# Patient Record
Sex: Male | Born: 1965 | Race: Black or African American | Hispanic: No | Smoking: Current every day smoker
Health system: Southern US, Community
[De-identification: ages and names within clinical notes are randomized; demographics above are authoritative.]

## PROBLEM LIST (undated history)

## (undated) DIAGNOSIS — I10 Essential (primary) hypertension: Secondary | ICD-10-CM

## (undated) DIAGNOSIS — M25519 Pain in unspecified shoulder: Secondary | ICD-10-CM

## (undated) DIAGNOSIS — I639 Cerebral infarction, unspecified: Secondary | ICD-10-CM

## (undated) DIAGNOSIS — G8929 Other chronic pain: Secondary | ICD-10-CM

---

## 2004-09-09 ENCOUNTER — Emergency Department (HOSPITAL_COMMUNITY): Admission: EM | Admit: 2004-09-09 | Discharge: 2004-09-09 | Payer: Self-pay | Admitting: Emergency Medicine

## 2004-09-11 ENCOUNTER — Emergency Department (HOSPITAL_COMMUNITY): Admission: EM | Admit: 2004-09-11 | Discharge: 2004-09-11 | Payer: Self-pay | Admitting: Emergency Medicine

## 2004-09-16 ENCOUNTER — Other Ambulatory Visit: Admission: RE | Admit: 2004-09-16 | Discharge: 2004-09-16 | Payer: Self-pay | Admitting: General Surgery

## 2005-03-31 ENCOUNTER — Emergency Department (HOSPITAL_COMMUNITY): Admission: EM | Admit: 2005-03-31 | Discharge: 2005-03-31 | Payer: Self-pay | Admitting: Emergency Medicine

## 2008-09-17 ENCOUNTER — Emergency Department (HOSPITAL_COMMUNITY): Admission: EM | Admit: 2008-09-17 | Discharge: 2008-09-17 | Payer: Self-pay | Admitting: Emergency Medicine

## 2008-12-31 ENCOUNTER — Emergency Department (HOSPITAL_COMMUNITY): Admission: EM | Admit: 2008-12-31 | Discharge: 2008-12-31 | Payer: Self-pay | Admitting: Emergency Medicine

## 2010-11-26 ENCOUNTER — Emergency Department (HOSPITAL_COMMUNITY)
Admission: EM | Admit: 2010-11-26 | Discharge: 2010-11-26 | Disposition: A | Discharge status: Home / Self Care | Payer: BC Managed Care – PPO | Attending: Emergency Medicine | Admitting: Emergency Medicine

## 2010-11-26 DIAGNOSIS — E860 Dehydration: Secondary | ICD-10-CM | POA: Insufficient documentation

## 2010-11-26 LAB — CBC
Hemoglobin: 14.6 g/dL (ref 13.0–17.0)
MCH: 28 pg (ref 26.0–34.0)
Platelets: 235 10*3/uL (ref 150–400)
RBC: 5.22 MIL/uL (ref 4.22–5.81)
WBC: 8.4 10*3/uL (ref 4.0–10.5)

## 2010-11-26 LAB — BASIC METABOLIC PANEL
CO2: 23 mEq/L (ref 19–32)
Chloride: 101 mEq/L (ref 96–112)
GFR calc Af Amer: >60 mL/min (ref >60)
Potassium: 4.4 mEq/L (ref 3.5–5.1)
Sodium: 135 mEq/L (ref 135–145)

## 2010-11-26 LAB — URINALYSIS, ROUTINE W REFLEX MICROSCOPIC
Glucose, UA: NEGATIVE mg/dL
Ketones, ur: 15 mg/dL — AB
Leukocytes, UA: NEGATIVE
Protein, ur: NEGATIVE mg/dL
pH: 5.5 (ref 5.0–8.0)

## 2010-11-26 LAB — DIFFERENTIAL
Basophils Relative: 1 % (ref 0–1)
Eosinophils Absolute: 0.1 10*3/uL (ref 0.0–0.7)
Monocytes Relative: 6 % (ref 3–12)
Neutro Abs: 5.5 10*3/uL (ref 1.7–7.7)
Neutrophils Relative %: 66 % (ref 43–77)

## 2010-11-26 LAB — URINE MICROSCOPIC-ADD ON

## 2010-12-05 NOTE — Consult Note (Signed)
NAMELONGINO, TREFZ NO.:  [id number]   MEDICAL RECORD NO.:  [id number]          PATIENT TYPE:  EMS   LOCATION:  ED                            FACILITY:  [facility]   PHYSICIAN:  Barbaraann Barthel, M.D. DATE OF BIRTH:  [birth year]   DATE OF CONSULTATION:  DATE OF DISCHARGE:  09/11/2004                                   CONSULTATION   EMERGENCY ROOM FOLLOW-UP:  Dear Dr. Rosalia Hammers:   I saw [name] in my office on September 16, 2004, as a referral from the  emergency department.  He had a partially drained abscess on his posterior  scalp.  This was partially drained in the emergency room and cultures were  obtained there.  The results of these have not been sent to [place] as yet.   In essence, I opened this up widely and removed what appeared to be chronic  granulation tissue.  This could possibly be an epidermoid inclusion cyst,  but this possibly could be a tumor as well.  I sent this to pathology for  follow-up.  The wound was packed open, and we will continue wound care here  in my office.  We have made arrangements for him on the following day on  September 17, 2004, and the patient did not show up.  We have called him on September 18, 2004, and he still did not show up.  I suspect you will be seeing him in  the emergency room and hopefully not in great extremis.   At any rate, we have tried to oblige with this emergency room referral and  the patient has been very noncompliant.   Your sincerely,      WB/MEDQ  D:  09/18/2004  T:  09/19/2004  Job:  [id number]   cc:   Cameron [name], M.D.  [address]  [place]  [place] [address]  Fax: [phone]

## 2014-07-09 ENCOUNTER — Emergency Department (HOSPITAL_COMMUNITY)
Admission: EM | Admit: 2014-07-09 | Discharge: 2014-07-09 | Disposition: A | Discharge status: Home / Self Care | Payer: PRIVATE HEALTH INSURANCE | Attending: Emergency Medicine | Admitting: Emergency Medicine

## 2014-07-09 ENCOUNTER — Emergency Department (HOSPITAL_COMMUNITY): Payer: PRIVATE HEALTH INSURANCE

## 2014-07-09 ENCOUNTER — Encounter (HOSPITAL_COMMUNITY): Payer: Self-pay | Admitting: Emergency Medicine

## 2014-07-09 DIAGNOSIS — S00211A Abrasion of right eyelid and periocular area, initial encounter: Secondary | ICD-10-CM

## 2014-07-09 DIAGNOSIS — S6992XA Unspecified injury of left wrist, hand and finger(s), initial encounter: Secondary | ICD-10-CM | POA: Diagnosis present

## 2014-07-09 DIAGNOSIS — Z720 Tobacco use: Secondary | ICD-10-CM | POA: Diagnosis not present

## 2014-07-09 DIAGNOSIS — Y998 Other external cause status: Secondary | ICD-10-CM | POA: Insufficient documentation

## 2014-07-09 DIAGNOSIS — Y9289 Other specified places as the place of occurrence of the external cause: Secondary | ICD-10-CM | POA: Diagnosis not present

## 2014-07-09 DIAGNOSIS — Z79899 Other long term (current) drug therapy: Secondary | ICD-10-CM | POA: Diagnosis not present

## 2014-07-09 DIAGNOSIS — W19XXXA Unspecified fall, initial encounter: Secondary | ICD-10-CM

## 2014-07-09 DIAGNOSIS — Z791 Long term (current) use of non-steroidal anti-inflammatories (NSAID): Secondary | ICD-10-CM | POA: Insufficient documentation

## 2014-07-09 DIAGNOSIS — S60222A Contusion of left hand, initial encounter: Secondary | ICD-10-CM

## 2014-07-09 DIAGNOSIS — Y9389 Activity, other specified: Secondary | ICD-10-CM | POA: Insufficient documentation

## 2014-07-09 DIAGNOSIS — Z859 Personal history of malignant neoplasm, unspecified: Secondary | ICD-10-CM | POA: Diagnosis not present

## 2014-07-09 DIAGNOSIS — S0081XA Abrasion of other part of head, initial encounter: Secondary | ICD-10-CM | POA: Insufficient documentation

## 2014-07-09 MED ORDER — IBUPROFEN 800 MG PO TABS: 800.0000 mg | ORAL_TABLET | Freq: Three times a day (TID) | ORAL | Status: DC

## 2014-07-09 MED ORDER — HYDROCODONE-ACETAMINOPHEN 5-325 MG PO TABS: 1.0000 | ORAL_TABLET | ORAL | Status: DC | PRN

## 2014-07-09 MED ORDER — OXYCODONE-ACETAMINOPHEN 5-325 MG PO TABS
1.0000 | ORAL_TABLET | Freq: Once | ORAL | Status: AC
  Administered 2014-07-09: 1 via ORAL
  Filled 2014-07-09: qty 1

## 2014-07-09 MED ORDER — OXYCODONE-ACETAMINOPHEN 5-325 MG PO TABS: 2.0000 | ORAL_TABLET | ORAL | Status: DC | PRN

## 2014-07-09 MED ORDER — BACITRACIN-NEOMYCIN-POLYMYXIN 400-5-5000 EX OINT
TOPICAL_OINTMENT | Freq: Once | CUTANEOUS | Status: AC
  Administered 2014-07-09: 01:00:00 via TOPICAL
  Filled 2014-07-09: qty 1

## 2014-07-09 NOTE — ED Notes (Signed)
Discharge instructions given, pt demonstrated teach back and verbal understanding. No concerns voiced.  
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
## 2014-07-09 NOTE — Discharge Instructions (Signed)
Bicycling, Rules for Helmets °Properly wearing a helmet when cycling is your best means of protection against injury. You need to know the right way to wear a helmet and what kind of helmet to buy. °WEAR A HELMET °· A helmet is your last line of defense in an accident; never ride without one. °· Helmets can reduce serious head injuries by 85% in a crash. °ALWAYS WEAR A PROPERLY FITTING HELMET °· A helmet will not protect you if it does not fit properly. °· Make sure that the helmet fits on top of the head, not tipped back. °· Always wear a helmet while riding a bike, no matter how short the trip. °· After a crash or any impact that affects your helmet, replace it immediately. °SHELL AND PADS °· Find the smallest helmet shell size that fits over your head. °· Helmet pads should not be used to make a helmet fit your head that is otherwise too big. °· Leave about two-fingers width between your eyebrows and the front brim of the helmet. °STRAPS °· The straps should be joined just under each ear at the jawbone. °· The buckle should be snug with your mouth completely open. °· Periodically check your strap adjustment. Improper fit can make a helmet useless. °VENTILATION °· In general, the more vents the better. Improper ventilation can cause overheating. °· Helmets with good ventilation can actually be cooler than riding with no helmet at all. °· More vents usually mean a higher priced helmet. Buy one that you will want to wear. °COLORS °· Helmets come in all different colors and models. Buy a highly visible color. °· Shell color does not affect the temperature; black shell will not be hotter in the sun. °· Pick a color that encourages you to wear it. °Document Released: 07/09/2003 Document Revised: 09/28/2011 Document Reviewed: 11/30/2008 °ExitCare® Patient Information ©2015 [employer]. This information is not intended to replace advice given to you by your health care provider. Make sure you discuss any questions you  have with your health care provider. ° °

## 2016-03-10 ENCOUNTER — Encounter: Payer: Self-pay | Admitting: Orthopaedic Surgery

## 2016-03-10 ENCOUNTER — Ambulatory Visit (INDEPENDENT_AMBULATORY_CARE_PROVIDER_SITE_OTHER): Payer: BLUE CROSS/BLUE SHIELD

## 2016-03-10 ENCOUNTER — Ambulatory Visit (INDEPENDENT_AMBULATORY_CARE_PROVIDER_SITE_OTHER): Payer: BLUE CROSS/BLUE SHIELD | Admitting: Orthopaedic Surgery

## 2016-03-10 VITALS — BP 160/105 | HR 91 | Temp 98.2°F | Resp 16 | Wt 190.0 lb

## 2016-03-10 DIAGNOSIS — Z720 Tobacco use: Secondary | ICD-10-CM | POA: Diagnosis not present

## 2016-03-10 DIAGNOSIS — M5441 Lumbago with sciatica, right side: Secondary | ICD-10-CM | POA: Diagnosis not present

## 2016-03-10 DIAGNOSIS — F17200 Nicotine dependence, unspecified, uncomplicated: Secondary | ICD-10-CM

## 2016-03-10 MED ORDER — PREDNISONE 5 MG (21) PO TBPK: ORAL_TABLET | ORAL | 0 refills | Status: DC

## 2016-03-10 MED ORDER — TIZANIDINE HCL 4 MG PO TABS: ORAL_TABLET | ORAL | 3 refills | Status: DC

## 2016-03-10 MED ORDER — HYDROCODONE-ACETAMINOPHEN 7.5-325 MG PO TABS: ORAL_TABLET | ORAL | 0 refills | Status: DC

## 2016-03-10 NOTE — Progress Notes (Signed)
Subjective: my back hurts    Patient ID: Cameron [name], male    DOB: Jan 03, 1966, 50 y.o.   MRN: [id number]  HPI He has had pain in the lower back since Friday the 18th of August.  He awoke and noted his lower back was very painful.  He has had pain down the right leg to the right foot.  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  He does not recall any motion or injury.  He has no bowel or bladder problems.  He is not getting any better.  He cannot fully stand erect without pain.    He has tried ice, heat, rest, Advil with no help.  He was unable to go to work yesterday or today.  He has not had any problem like this before.   Review of Systems  HENT: Negative for congestion.   Respiratory: Negative for cough and shortness of breath.   Cardiovascular: Negative for chest pain and leg swelling.  Endocrine: Negative for cold intolerance.  Musculoskeletal: Positive for arthralgias and back pain.  Allergic/Immunologic: Negative for environmental allergies.   Past Medical History:  Diagnosis Date  . Cancer (HCC)     No past surgical history on file.  Current Outpatient Prescriptions on File Prior to Visit  Medication Sig Dispense Refill  . lisinopril (PRINIVIL,ZESTRIL) 30 MG tablet Take 30 mg by mouth daily.     No current facility-administered medications on file prior to visit.     Social History   Social History  . Marital status: Single    Spouse name: N/A  . Number of children: N/A  . Years of education: N/A   Occupational History  . Not on file.   Social History Main Topics  . Smoking status: Current Every Day Smoker  . Smokeless tobacco: Never Used  . Alcohol use Yes  . Drug use: No  . Sexual activity: Not on file   Other Topics Concern  . Not on file   Social History Narrative  . No narrative on file    History of hypertension and heart disease in the family.  BP (!) 160/105   Pulse 91   Temp 98.2 F (36.8 C)   Resp 16   Wt 190 lb (86.2 kg)    BMI 27.26 kg/m       Objective:   Physical Exam  Constitutional: He is oriented to person, place, and time. He appears well-developed and well-nourished.  HENT:  Head: Normocephalic and atraumatic.  Eyes: Conjunctivae and EOM are normal. Pupils are equal, round, and reactive to light.  Neck: Normal range of motion. Neck supple.  Cardiovascular: Normal rate, regular rhythm and intact distal pulses.   Pulmonary/Chest: Effort normal.  Abdominal: Soft.  Musculoskeletal: He exhibits tenderness (Back is tender with motion.  Unable to fully stand erect without pain.).  Neurological: He is alert and oriented to person, place, and time. He has normal reflexes. He displays normal reflexes. No cranial nerve deficit. He exhibits normal muscle tone. Coordination normal.  Skin: Skin is warm and dry.  Psychiatric: He has a normal mood and affect. His behavior is normal. Judgment and thought content normal.  Vitals reviewed.  Spine/Pelvis examination:  Inspection:  Overall, sacoiliac joint benign and hips nontender; without crepitus or defects.   Thoracic spine inspection: Alignment normal without kyphosis present   Lumbar spine inspection:  Alignment  with normal lumbar lordosis, without scoliosis apparent.   Thoracic spine palpation:  without tenderness  of spinal processes   Lumbar spine palpation: with tenderness of lumbar area; with tightness of lumbar muscles    Range of Motion:   Lumbar flexion, forward flexion is 20 with pain or tenderness    Lumbar extension is 5 with pain or tenderness   Left lateral bend is Normal  without pain or tenderness   Right lateral bend is Normal without pain or tenderness   Straight leg raising is Abnormal- at 30 degrees on the right   Strength & tone: Normal   Stability overall normal stability    X-rays were done of the lumbar spine, reported separately.  He smokes and I have talked to him about this.  He will consider cutting back.      Assessment & Plan:   Encounter Diagnoses  Name Primary?  . Right-sided low back pain with right-sided sciatica Yes  . Tobacco smoker within last 12 months    I am concerned about a HNP on the right side of the lumbar spine.  I have given Rx for pain medicine and prednisone dose pack.  Precautions discussed.  I have recommended he stay out of work.  Return in one week.  Exercises given.  He may need MRI.  Call if any problem.  Electronically Signed Darreld McleanWayne Jadore Veals, MD 8/22/201711:56 AM

## 2016-03-10 NOTE — Patient Instructions (Signed)
May be out of work secondary to lower back pain and sciatica. Back Exercises The following exercises strengthen the muscles that help to support the back. They also help to keep the lower back flexible. Doing these exercises can help to prevent back pain or lessen existing pain. If you have back pain or discomfort, try doing these exercises 2-3 times each day or as told by your health care provider. When the pain goes away, do them once each day, but increase the number of times that you repeat the steps for each exercise (do more repetitions). If you do not have back pain or discomfort, do these exercises once each day or as told by your health care provider. EXERCISES Single Knee to Chest Repeat these steps 3-5 times for each leg: 1. Lie on your back on a firm bed or the floor with your legs extended. 2. Bring one knee to your chest. Your other leg should stay extended and in contact with the floor. 3. Hold your knee in place by grabbing your knee or thigh. 4. Pull on your knee until you feel a gentle stretch in your lower back. 5. Hold the stretch for 10-30 seconds. 6. Slowly release and straighten your leg. Pelvic Tilt Repeat these steps 5-10 times: 1. Lie on your back on a firm bed or the floor with your legs extended. 2. Bend your knees so they are pointing toward the ceiling and your feet are flat on the floor. 3. Tighten your lower abdominal muscles to press your lower back against the floor. This motion will tilt your pelvis so your tailbone points up toward the ceiling instead of pointing to your feet or the floor. 4. With gentle tension and even breathing, hold this position for 5-10 seconds. Cat-Cow Repeat these steps until your lower back becomes more flexible: 1. Get into a hands-and-knees position on a firm surface. Keep your hands under your shoulders, and keep your knees under your hips. You may place padding under your knees for comfort. 2. Let your head hang down, and point  your tailbone toward the floor so your lower back becomes rounded like the back of a cat. 3. Hold this position for 5 seconds. 4. Slowly lift your head and point your tailbone up toward the ceiling so your back forms a sagging arch like the back of a cow. 5. Hold this position for 5 seconds. Press-Ups Repeat these steps 5-10 times: 1. Lie on your abdomen (face-down) on the floor. 2. Place your palms near your head, about shoulder-width apart. 3. While you keep your back as relaxed as possible and keep your hips on the floor, slowly straighten your arms to raise the top half of your body and lift your shoulders. Do not use your back muscles to raise your upper torso. You may adjust the placement of your hands to make yourself more comfortable. 4. Hold this position for 5 seconds while you keep your back relaxed. 5. Slowly return to lying flat on the floor. Bridges Repeat these steps 10 times: 1. Lie on your back on a firm surface. 2. Bend your knees so they are pointing toward the ceiling and your feet are flat on the floor. 3. Tighten your buttocks muscles and lift your buttocks off of the floor until your waist is at almost the same height as your knees. You should feel the muscles working in your buttocks and the back of your thighs. If you do not feel these muscles, slide your feet 1-2  inches farther away from your buttocks. 4. Hold this position for 3-5 seconds. 5. Slowly lower your hips to the starting position, and allow your buttocks muscles to relax completely. If this exercise is too easy, try doing it with your arms crossed over your chest. Abdominal Crunches Repeat these steps 5-10 times: 1. Lie on your back on a firm bed or the floor with your legs extended. 2. Bend your knees so they are pointing toward the ceiling and your feet are flat on the floor. 3. Cross your arms over your chest. 4. Tip your chin slightly toward your chest without bending your neck. 5. Tighten your  abdominal muscles and slowly raise your trunk (torso) high enough to lift your shoulder blades a tiny bit off of the floor. Avoid raising your torso higher than that, because it can put too much stress on your low back and it does not help to strengthen your abdominal muscles. 6. Slowly return to your starting position. Back Lifts Repeat these steps 5-10 times: 1. Lie on your abdomen (face-down) with your arms at your sides, and rest your forehead on the floor. 2. Tighten the muscles in your legs and your buttocks. 3. Slowly lift your chest off of the floor while you keep your hips pressed to the floor. Keep the back of your head in line with the curve in your back. Your eyes should be looking at the floor. 4. Hold this position for 3-5 seconds. 5. Slowly return to your starting position. SEEK MEDICAL CARE IF:  Your back pain or discomfort gets much worse when you do an exercise.  Your back pain or discomfort does not lessen within 2 hours after you exercise. If you have any of these problems, stop doing these exercises right away. Do not do them again unless your health care provider says that you can. SEEK IMMEDIATE MEDICAL CARE IF:  You develop sudden, severe back pain. If this happens, stop doing the exercises right away. Do not do them again unless your health care provider says that you can.   This information is not intended to replace advice given to you by your health care provider. Make sure you discuss any questions you have with your health care provider.   Document Released: 08/13/2004 Document Revised: 03/27/2015 Document Reviewed: 08/30/2014 Elsevier Interactive Patient Education [employer]2016 [employer] Inc[employer]

## 2016-03-17 ENCOUNTER — Ambulatory Visit (INDEPENDENT_AMBULATORY_CARE_PROVIDER_SITE_OTHER): Payer: BLUE CROSS/BLUE SHIELD | Admitting: Orthopaedic Surgery

## 2016-03-17 ENCOUNTER — Encounter: Payer: Self-pay | Admitting: Orthopaedic Surgery

## 2016-03-17 ENCOUNTER — Other Ambulatory Visit: Payer: Self-pay | Admitting: Radiology

## 2016-03-17 VITALS — BP 152/98 | HR 97 | Ht 70.0 in | Wt 191.0 lb

## 2016-03-17 DIAGNOSIS — M5441 Lumbago with sciatica, right side: Secondary | ICD-10-CM

## 2016-03-17 DIAGNOSIS — F17200 Nicotine dependence, unspecified, uncomplicated: Secondary | ICD-10-CM

## 2016-03-17 DIAGNOSIS — Z720 Tobacco use: Secondary | ICD-10-CM | POA: Diagnosis not present

## 2016-03-17 MED ORDER — OXYCODONE-ACETAMINOPHEN 5-325 MG PO TABS: 1.0000 | ORAL_TABLET | ORAL | 0 refills | Status: DC | PRN

## 2016-03-17 MED ORDER — DICLOFENAC SODIUM 75 MG PO TBEC: 75.0000 mg | DELAYED_RELEASE_TABLET | Freq: Two times a day (BID) | ORAL | 2 refills | Status: DC

## 2016-03-17 NOTE — Progress Notes (Signed)
Patient VW:UJWJXBJ:Tor Florentina Jenny Lal, male DOB:08/19/1965, 50 y.o. YNW:[id number]  Chief Complaint  Patient presents with  . Follow-up    Back Pain  . Knee Pain    Right     HPI  Solon AugustaRichard E Cahn is a 50 y.o. male who has lower back pain and right knee pain.  He took the prednisone and it helped.  He went to work Friday but his pain returned.  His back is less tender but his right knee is hurting more.  I will begin PT for him, note to stay out of work.  Return in one week.  If not better consider MRI. HPI  Body mass index is 27.41 kg/m.  ROS  Review of Systems  HENT: Negative for congestion.   Respiratory: Negative for cough and shortness of breath.   Cardiovascular: Negative for chest pain and leg swelling.  Endocrine: Negative for cold intolerance.  Musculoskeletal: Positive for arthralgias and back pain.  Allergic/Immunologic: Negative for environmental allergies.    Past Medical History:  Diagnosis Date  . Cancer [facility](HCC)     History reviewed. No pertinent surgical history.  History reviewed. No pertinent family history.  Social History Social History  Substance Use Topics  . Smoking status: Current Every Day Smoker  . Smokeless tobacco: Never Used  . Alcohol use Yes    No Known Allergies  Current Outpatient Prescriptions  Medication Sig Dispense Refill  . lisinopril (PRINIVIL,ZESTRIL) 30 MG tablet Take 30 mg by mouth daily.    . predniSONE (STERAPRED UNI-PAK 21 TAB) 5 MG (21) TBPK tablet Take 6 pills first day; 5 pills second day; 4 pills third day; 3 pills fourth day; 2 pills next day and 1 pill last day. 21 tablet 0  . tiZANidine (ZANAFLEX) 4 MG tablet One by mouth every 8 hours as needed for spasm 40 tablet 3  . diclofenac (VOLTAREN) 75 MG EC tablet Take 1 tablet (75 mg total) by mouth 2 (two) times daily with a meal. 60 tablet 2  . oxyCODONE-acetaminophen (PERCOCET/ROXICET) 5-325 MG tablet Take 1 tablet by mouth every 4 (four) hours as needed for moderate pain or severe  pain (Must last 14 days.Do not drive or operate machinery while taking this medicine). 60 tablet 0   No current facility-administered medications for this visit.      Physical Exam  Blood pressure (!) 152/98, pulse 97, height 5\' 10"  (1.778 m), weight 191 lb (86.6 kg).  Constitutional: overall normal hygiene, normal nutrition, well developed, normal grooming, normal body habitus. Assistive device:none  Musculoskeletal: gait and station Limp right, muscle tone and strength are normal, no tremors or atrophy is present.  .  Neurological: coordination overall normal.  Deep tendon reflex/nerve stretch intact.  Sensation normal.  Cranial nerves II-XII intact.   Skin:   normal overall no scars, lesions, ulcers or rashes. No psoriasis.  Psychiatric: Alert and oriented x 3.  Recent memory intact, remote memory unclear.  Normal mood and affect. Well groomed.  Good eye contact.  Cardiovascular: overall no swelling, no varicosities, no edema bilaterally, normal temperatures of the legs and arms, no clubbing, cyanosis and good capillary refill.  Lymphatic: palpation is normal.  Spine/Pelvis examination:  Inspection:  Overall, sacoiliac joint benign and hips nontender; without crepitus or defects.   Thoracic spine inspection: Alignment normal without kyphosis present   Lumbar spine inspection:  Alignment  with normal lumbar lordosis, without scoliosis apparent.   Thoracic spine palpation:  without tenderness of spinal processes   Lumbar spine  palpation: with tenderness of lumbar area; without tightness of lumbar muscles    Range of Motion:   Lumbar flexion, forward flexion is 40 with pain or tenderness    Lumbar extension is 10 with pain or tenderness   Left lateral bend is Normal  without pain or tenderness   Right lateral bend is Normal without pain or tenderness   Straight leg raising is Normal   Strength & tone: Normal   Stability overall normal stability   The right lower  extremity is examined:  Inspection:  Thigh:  Non-tender and no defects  Knee has swelling 1+ effusion.                        Joint tenderness is present                        Patient is tender over the medial joint line  Lower Leg:  Has normal appearance and no tenderness or defects  Ankle:  Non-tender and no defects  Foot:  Non-tender and no defects Range of Motion:  Knee:  Range of motion is: 0-110                        Crepitus is  present  Ankle:  Range of motion is normal. Strength and Tone:  The right lower extremity has normal strength and tone. Stability:  Knee:  The knee is stable.  Ankle:  The ankle is stable.   He continues to smoke and has read the information given last visit.  He will consider it.  The patient has been educated about the nature of the problem(s) and counseled on treatment options.  The patient appeared to understand what I have discussed and is in agreement with it.  Encounter Diagnoses  Name Primary?  . Right-sided low back pain with right-sided sciatica Yes  . Tobacco smoker within last 12 months     PLAN Call if any problems.  Precautions discussed.  Continue current medications.   Return to clinic 1 week  Electronically Signed Darreld Mclean, MD 8/29/20178:45 AM

## 2016-03-17 NOTE — Patient Instructions (Addendum)
Smoking Cessation, Tips for Success If you are ready to quit smoking, congratulations! You have chosen to help yourself be healthier. Cigarettes bring nicotine, tar, carbon monoxide, and other irritants into your body. Your lungs, heart, and blood vessels will be able to work better without these poisons. There are many different ways to quit smoking. Nicotine gum, nicotine patches, a nicotine inhaler, or nicotine nasal spray can help with physical craving. Hypnosis, support groups, and medicines help break the habit of smoking. WHAT THINGS CAN I DO TO MAKE QUITTING EASIER?  Here are some tips to help you quit for good:  Pick a date when you will quit smoking completely. Tell all of your friends and family about your plan to quit on that date.  Do not try to slowly cut down on the number of cigarettes you are smoking. Pick a quit date and quit smoking completely starting on that day.  Throw away all cigarettes.   Clean and remove all ashtrays from your home, work, and car.  On a card, write down your reasons for quitting. Carry the card with you and read it when you get the urge to smoke.  Cleanse your body of nicotine. Drink enough water and fluids to keep your urine clear or pale yellow. Do this after quitting to flush the nicotine from your body.  Learn to predict your moods. Do not let a bad situation be your excuse to have a cigarette. Some situations in your life might tempt you into wanting a cigarette.  Never have "just one" cigarette. It leads to wanting another and another. Remind yourself of your decision to quit.  Change habits associated with smoking. If you smoked while driving or when feeling stressed, try other activities to replace smoking. Stand up when drinking your coffee. Brush your teeth after eating. Sit in a different chair when you read the paper. Avoid alcohol while trying to quit, and try to drink fewer caffeinated beverages. Alcohol and caffeine may urge you to  smoke.  Avoid foods and drinks that can trigger a desire to smoke, such as sugary or spicy foods and alcohol.  Ask people who smoke not to smoke around you.  Have something planned to do right after eating or having a cup of coffee. For example, plan to take a walk or exercise.  Try a relaxation exercise to calm you down and decrease your stress. Remember, you may be tense and nervous for the first 2 weeks after you quit, but this will pass.  Find new activities to keep your hands busy. Play with a pen, coin, or rubber band. Doodle or draw things on paper.  Brush your teeth right after eating. This will help cut down on the craving for the taste of tobacco after meals. You can also try mouthwash.   Use oral substitutes in place of cigarettes. Try using lemon drops, carrots, cinnamon sticks, or chewing gum. Keep them handy so they are available when you have the urge to smoke.  When you have the urge to smoke, try deep breathing.  Designate your home as a nonsmoking area.  If you are a heavy smoker, ask your health care provider about a prescription for nicotine chewing gum. It can ease your withdrawal from nicotine.  Reward yourself. Set aside the cigarette money you save and buy yourself something nice.  Look for support from others. Join a support group or smoking cessation program. Ask someone at home or at work to help you with your plan   to quit smoking.  Always ask yourself, "Do I need this cigarette or is this just a reflex?" Tell yourself, "Today, I choose not to smoke," or "I do not want to smoke." You are reminding yourself of your decision to quit.  Do not replace cigarette smoking with electronic cigarettes (commonly called e-cigarettes). The safety of e-cigarettes is unknown, and some may contain harmful chemicals.  If you relapse, do not give up! Plan ahead and think about what you will do the next time you get the urge to smoke. HOW WILL I FEEL WHEN I QUIT SMOKING? You  may have symptoms of withdrawal because your body is used to nicotine (the addictive substance in cigarettes). You may crave cigarettes, be irritable, feel very hungry, cough often, get headaches, or have difficulty concentrating. The withdrawal symptoms are only temporary. They are strongest when you first quit but will go away within 10-14 days. When withdrawal symptoms occur, stay in control. Think about your reasons for quitting. Remind yourself that these are signs that your body is healing and getting used to being without cigarettes. Remember that withdrawal symptoms are easier to treat than the major diseases that smoking can cause.  Even after the withdrawal is over, expect periodic urges to smoke. However, these cravings are generally short lived and will go away whether you smoke or not. Do not smoke! WHAT RESOURCES ARE AVAILABLE TO HELP ME QUIT SMOKING? Your health care provider can direct you to community resources or hospitals for support, which may include:  Group support.  Education.  Hypnosis.  Therapy.   This information is not intended to replace advice given to you by your health care provider. Make sure you discuss any questions you have with your health care provider.   Document Released: 04/03/2004 Document Revised: 07/27/2014 Document Reviewed: 12/22/2012 Elsevier Interactive Patient Education 2016 Elsevier Inc.  Generic Knee Exercises EXERCISES RANGE OF MOTION (ROM) AND STRETCHING EXERCISES These exercises may help you when beginning to rehabilitate your injury. Your symptoms may resolve with or without further involvement from your physician, physical therapist, or athletic trainer. While completing these exercises, remember:   Restoring tissue flexibility helps normal motion to return to the joints. This allows healthier, less painful movement and activity.  An effective stretch should be held for at least 30 seconds.  A stretch should never be painful. You  should only feel a gentle lengthening or release in the stretched tissue. STRETCH - Knee Extension, Prone  Lie on your stomach on a firm surface, such as a bed or countertop. Place your right / left knee and leg just beyond the edge of the surface. You may wish to place a towel under the far end of your right / left thigh for comfort.  Relax your leg muscles and allow gravity to straighten your knee. Your clinician may advise you to add an ankle weight if more resistance is helpful for you.  You should feel a stretch in the back of your right / left knee. Hold this position for __________ seconds. Repeat __________ times. Complete this stretch __________ times per day. * Your physician, physical therapist, or athletic trainer may ask you to add ankle weight to enhance your stretch.  RANGE OF MOTION - Knee Flexion, Active  Lie on your back with both knees straight. (If this causes back discomfort, bend your opposite knee, placing your foot flat on the floor.)  Slowly slide your heel back toward your buttocks until you feel a gentle stretch in the   front of your knee or thigh.  Hold for __________ seconds. Slowly slide your heel back to the starting position. Repeat __________ times. Complete this exercise __________ times per day.  STRETCH - Quadriceps, Prone   Lie on your stomach on a firm surface, such as a bed or padded floor.  Bend your right / left knee and grasp your ankle. If you are unable to reach your ankle or pant leg, use a belt around your foot to lengthen your reach.  Gently pull your heel toward your buttocks. Your knee should not slide out to the side. You should feel a stretch in the front of your thigh and/or knee.  Hold this position for __________ seconds. Repeat __________ times. Complete this stretch __________ times per day.  STRETCH - Hamstrings, Supine   Lie on your back. Loop a belt or towel over the ball of your right / left foot.  Straighten your right / left  knee and slowly pull on the belt to raise your leg. Do not allow the right / left knee to bend. Keep your opposite leg flat on the floor.  Raise the leg until you feel a gentle stretch behind your right / left knee or thigh. Hold this position for __________ seconds. Repeat __________ times. Complete this stretch __________ times per day.  STRENGTHENING EXERCISES These exercises may help you when beginning to rehabilitate your injury. They may resolve your symptoms with or without further involvement from your physician, physical therapist, or athletic trainer. While completing these exercises, remember:   Muscles can gain both the endurance and the strength needed for everyday activities through controlled exercises.  Complete these exercises as instructed by your physician, physical therapist, or athletic trainer. Progress the resistance and repetitions only as guided.  You may experience muscle soreness or fatigue, but the pain or discomfort you are trying to eliminate should never worsen during these exercises. If this pain does worsen, stop and make certain you are following the directions exactly. If the pain is still present after adjustments, discontinue the exercise until you can discuss the trouble with your clinician. STRENGTH - Quadriceps, Isometrics  Lie on your back with your right / left leg extended and your opposite knee bent.  Gradually tense the muscles in the front of your right / left thigh. You should see either your knee cap slide up toward your hip or increased dimpling just above the knee. This motion will push the back of the knee down toward the floor/mat/bed on which you are lying.  Hold the muscle as tight as you can without increasing your pain for __________ seconds.  Relax the muscles slowly and completely in between each repetition. Repeat __________ times. Complete this exercise __________ times per day.  STRENGTH - Quadriceps, Short Arcs   Lie on your back.  Place a __________ inch towel roll under your knee so that the knee slightly bends.  Raise only your lower leg by tightening the muscles in the front of your thigh. Do not allow your thigh to rise.  Hold this position for __________ seconds. Repeat __________ times. Complete this exercise __________ times per day.  OPTIONAL ANKLE WEIGHTS: Begin with ____________________, but DO NOT exceed ____________________. Increase in 1 pound/0.5 kilogram increments.  STRENGTH - Quadriceps, Straight Leg Raises  Quality counts! Watch for signs that the quadriceps muscle is working to insure you are strengthening the correct muscles and not "cheating" by substituting with healthier muscles.  Lay on your back with your right /   left leg extended and your opposite knee bent.  Tense the muscles in the front of your right / left thigh. You should see either your knee cap slide up or increased dimpling just above the knee. Your thigh may even quiver.  Tighten these muscles even more and raise your leg 4 to 6 inches off the floor. Hold for __________ seconds.  Keeping these muscles tense, lower your leg.  Relax the muscles slowly and completely in between each repetition. Repeat __________ times. Complete this exercise __________ times per day.  STRENGTH - Hamstring, Curls  Lay on your stomach with your legs extended. (If you lay on a bed, your feet may hang over the edge.)  Tighten the muscles in the back of your thigh to bend your right / left knee up to 90 degrees. Keep your hips flat on the bed/floor.  Hold this position for __________ seconds.  Slowly lower your leg back to the starting position. Repeat __________ times. Complete this exercise __________ times per day.  OPTIONAL ANKLE WEIGHTS: Begin with ____________________, but DO NOT exceed ____________________. Increase in 1 pound/0.5 kilogram increments.  STRENGTH - Quadriceps, Squats  Stand in a door frame so that your feet and knees are in  line with the frame.  Use your hands for balance, not support, on the frame.  Slowly lower your weight, bending at the hips and knees. Keep your lower legs upright so that they are parallel with the door frame. Squat only within the range that does not increase your knee pain. Never let your hips drop below your knees.  Slowly return upright, pushing with your legs, not pulling with your hands. Repeat __________ times. Complete this exercise __________ times per day.  STRENGTH - Quadriceps, Wall Slides  Follow guidelines for form closely. Increased knee pain often results from poorly placed feet or knees.  Lean against a smooth wall or door and walk your feet out 18-24 inches. Place your feet hip-width apart.  Slowly slide down the wall or door until your knees bend __________ degrees.* Keep your knees over your heels, not your toes, and in line with your hips, not falling to either side.  Hold for __________ seconds. Stand up to rest for __________ seconds in between each repetition. Repeat __________ times. Complete this exercise __________ times per day. * Your physician, physical therapist, or athletic trainer will alter this angle based on your symptoms and progress.   This information is not intended to replace advice given to you by your health care provider. Make sure you discuss any questions you have with your health care provider.   Document Released: 05/20/2005 Document Revised: 07/27/2014 Document Reviewed: 10/18/2008 Elsevier Interactive Patient Education [employer]2016 Elsevier Inc.   Stay out of work.

## 2016-03-24 ENCOUNTER — Ambulatory Visit: Payer: BLUE CROSS/BLUE SHIELD | Admitting: Orthopaedic Surgery

## 2016-03-25 ENCOUNTER — Ambulatory Visit (HOSPITAL_COMMUNITY): Payer: BLUE CROSS/BLUE SHIELD | Attending: Orthopaedic Surgery | Admitting: Physical Therapy

## 2016-03-25 DIAGNOSIS — M5441 Lumbago with sciatica, right side: Secondary | ICD-10-CM | POA: Diagnosis not present

## 2016-03-25 DIAGNOSIS — R293 Abnormal posture: Secondary | ICD-10-CM | POA: Diagnosis present

## 2016-03-25 NOTE — Patient Instructions (Signed)
   SINGLE KNEE TO CHEST STRETCH - SKTC  While Lying on your back, hold your knee and gently pull it up towards your chest.  Hold for 10 seconds, repeat 5 times each side, once a day.    Lumbar Rotations   Lying on your back with your knees bent, slowly drop your legs to one side and hold the stretch. Come back to the middle and switch sides. You should feel the stretch in your back on the opposite side that your legs are leaning.   Hold for 10 seconds, repeat 5 times each side once a day.    [facility]  Start on all 4's and spread legs to make room for the belly.  Heels can be close together. Sit back onto heels with arms straight out infront of you.  Hold for 30 seconds, repeat 2-3 times once a day.  MAD CAT/OLD HORSE   When on all fours, arc your back up like a mad cat:         And then sink down like an old horse:         Repeat 10 times, once a day.

## 2016-03-25 NOTE — Therapy (Signed)
[facility] [address] [place], [place], [address] Phone: [phone]   Fax:  [phone]  Physical Therapy Evaluation  Patient Details  Name: Cameron [name] MRN: [id number] Date of Birth: Aug 01, 1965 Referring Provider: Darreld Mclean   Encounter Date: 03/25/2016      PT End of Session - 03/25/16 0935    Visit Number 1   Number of Visits 1   Authorization Type [employer] Other    Authorization Time Period 03/25/16 to 03/25/16   PT Start Time 0902   PT Stop Time 0930  simple eval/no further skilled services needed    PT Time Calculation (min) 28 min   Activity Tolerance Patient tolerated treatment well   Behavior During Therapy [facility] for tasks assessed/performed      Past Medical History:  Diagnosis Date  . Cancer (HCC)     No past surgical history on file.  There were no vitals filed for this visit.       Subjective Assessment - 03/25/16 0904    Subjective Patient reports one day he woke up with back pain (August 18th), that just kept getting worse as the day went on; he cannot remember a mode of injury or doing anything out of the ordinary that might have contributed to his pain. 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 He was just able to start walking again the other day, but before this he was bent over; he states that the other day he got into a certain position and popped throughout his back and hip and his pain has disappeared. He feels pretty good today.    Pertinent History no significant PMH    How long can you sit comfortably? unlimited    How long can you stand comfortably? unlimited    How long can you walk comfortably? unlimited    Diagnostic tests x-rays done on 8/22 indicate no acute changes or pathology    Patient Stated Goals not sure, does not want to mess with it, wants to go back to work    Currently in Pain? Yes   Pain Score 1    Pain Location Knee   Pain Orientation  Right   Pain Descriptors / Indicators Tingling   Pain Type Chronic pain   Pain Radiating Towards none    Pain Onset More than a month ago   Pain Frequency Intermittent   Aggravating Factors  going down incline   Pain Relieving Factors walking, activity    Effect of Pain on Daily Activities none             [facility] PT Assessment - 03/25/16 0001      Assessment   Medical Diagnosis R sided LBP with sciatica    Referring Provider Darreld Mclean    Onset Date/Surgical Date 03/06/16   Next MD Visit Dr. Hilda Lias 9/7     Balance Screen   Has the patient fallen in the past 6 months No   Has the patient had a decrease in activity level because of a fear of falling?  No   Is the patient reluctant to leave their home because of a fear of falling?  No     Prior Function   Level of Independence Independent;Independent with basic ADLs;Independent with gait;Independent with transfers   Vocation Full time employment   Vocation Requirements 8 hours    Leisure no hobbies      Posture/Postural Control  Posture/Postural Control Postural limitations   Postural Limitations Decreased lumbar lordosis;Decreased thoracic kyphosis;Rounded Shoulders;Forward head     AROM   Lumbar Flexion approx 6 inches from floor    Lumbar Extension WFL    Lumbar - Right Side Bend WFL    Lumbar - Left Side Bend [facility]      Strength   Right Hip Flexion 4/5   Right Hip Extension 3/5   Right Hip ABduction 4+/5   Left Hip Flexion 5/5   Left Hip Extension 3/5   Left Hip ABduction 4+/5   Right Knee Flexion 4/5   Right Knee Extension 4+/5   Left Knee Flexion 4/5   Left Knee Extension 4+/5   Right Ankle Dorsiflexion 4+/5   Left Ankle Dorsiflexion 4+/5     Palpation   Palpation comment hypomobiltiy noted through sacrum and lumbar spine with PAs today      6 minute walk test results    Aerobic Endurance Distance Walked 830   Endurance additional comments                            PT  Education - 03/25/16 0934    Education provided Yes   Education Details prognosis, recommended HEP, importance of regular physical activity    Person(s) Educated Patient   Methods Explanation;Demonstration;Handout   Comprehension Verbalized understanding;Returned demonstration          PT Short Term Goals - 03/25/16 0937      PT SHORT TERM GOAL #1   Title Patient to be independent in correctly and consistently performing appropriate maintenance HEP    Time 1   Period Days   Status New           PT Long Term Goals - 03/25/16 4540      PT LONG TERM GOAL #1   Title N/A- one time visit only                Plan - 03/25/16 0936    Clinical Impression Statement Patient arrives today after experiencing insidious onset of R sided LBP that started in mid-August after he fell asleep on the couch; patient reported that he had to walk bent over for quite some time but the other day was able to pop his back and hip, which appears to have completely resolved his pain as he feels quite good today. Upon examination, patient does reveal postural deficits and some lumbar stiffness; unable to replicate pain with repeated motions or palpations/PAs but did note some hypomobility throughout lumbar spine/sacrum today with PAs. Unable to reproduce pain in general today. Assigned patient lumbar mobility exercises to assist in preventing re-exacerbation or re-occurrence of pain. Patient not in need of further skilled PT services at this time.    Rehab Potential Excellent   PT Frequency One time visit   PT Duration Other (comment)  one time visit    PT Treatment/Interventions ADLs/Self Care Home Management   PT Next Visit Plan none- one skilled session only    PT Home Exercise Plan 9/6; SKTC, lumbar rotations, mad cat/old horse, child's pose    Consulted and Agree with Plan of Care Patient      Patient will benefit from skilled therapeutic intervention in order to improve the following  deficits and impairments:  Improper body mechanics, Pain, Postural dysfunction, Decreased strength, Hypomobility, Impaired flexibility  Visit Diagnosis: Right-sided low back pain with right-sided sciatica - Plan: PT plan of care  cert/re-cert  Abnormal posture - Plan: PT plan of care cert/re-cert     Problem List There are no active problems to display for this patient.   Nedra HaiKristen Lakeisa Heninger PT, DPT [phone]  [facility] [address] [place], [place], [address] Phone: [phone]   Fax:  [phone]  Name: Cameron [name] MRN: [id number] Date of Birth: 06-26-1966

## 2016-03-26 ENCOUNTER — Ambulatory Visit (INDEPENDENT_AMBULATORY_CARE_PROVIDER_SITE_OTHER): Payer: BLUE CROSS/BLUE SHIELD | Admitting: Orthopaedic Surgery

## 2016-03-26 ENCOUNTER — Encounter: Payer: Self-pay | Admitting: Orthopaedic Surgery

## 2016-03-26 VITALS — BP 156/101 | HR 83 | Temp 97.7°F | Ht 70.0 in | Wt 186.0 lb

## 2016-03-26 DIAGNOSIS — M5441 Lumbago with sciatica, right side: Secondary | ICD-10-CM

## 2016-03-26 DIAGNOSIS — Z720 Tobacco use: Secondary | ICD-10-CM

## 2016-03-26 DIAGNOSIS — F17200 Nicotine dependence, unspecified, uncomplicated: Secondary | ICD-10-CM

## 2016-03-26 NOTE — Progress Notes (Signed)
Patient Cameron [name]:Cameron [name], male DOB:05-06-1966, 50 y.o. VOZ:[id number]  Chief Complaint  Patient presents with  . Follow-up    Low Back Pain s/p therapy    HPI  Solon AugustaRichard E [name] is a [age] y.o. male who has had lower back pain.  He went to PT and has been doing his exercises. He is much improved now.  He has little pain.  He has returned to work with no problem. His right knee is not hurting either. HPI  Body mass index is 26.69 kg/m.  ROS  Review of Systems  HENT: Negative for congestion.   Respiratory: Negative for cough and shortness of breath.   Cardiovascular: Negative for chest pain and leg swelling.  Endocrine: Negative for cold intolerance.  Musculoskeletal: Positive for arthralgias and back pain.  Allergic/Immunologic: Negative for environmental allergies.    Past Medical History:  Diagnosis Date  . Cancer [facility](HCC)     History reviewed. No pertinent surgical history.  History reviewed. No pertinent family history.  Social History Social History  Substance Use Topics  . Smoking status: Current Every Day Smoker  . Smokeless tobacco: Never Used  . Alcohol use Yes    No Known Allergies  Current Outpatient Prescriptions  Medication Sig Dispense Refill  . diclofenac (VOLTAREN) 75 MG EC tablet Take 1 tablet (75 mg total) by mouth 2 (two) times daily with a meal. 60 tablet 2  . lisinopril (PRINIVIL,ZESTRIL) 30 MG tablet Take 30 mg by mouth daily.    Marland Kitchen. oxyCODONE-acetaminophen (PERCOCET/ROXICET) 5-325 MG tablet Take 1 tablet by mouth every 4 (four) hours as needed for moderate pain or severe pain (Must last 14 days.Do not drive or operate machinery while taking this medicine). 60 tablet 0  . predniSONE (STERAPRED UNI-PAK 21 TAB) 5 MG (21) TBPK tablet Take 6 pills first day; 5 pills second day; 4 pills third day; 3 pills fourth day; 2 pills next day and 1 pill last day. 21 tablet 0  . tiZANidine (ZANAFLEX) 4 MG tablet One by mouth every 8 hours as needed for spasm 40 tablet  3   No current facility-administered medications for this visit.      Physical Exam  Blood pressure (!) 156/101, pulse 83, temperature 97.7 F (36.5 C), height 5\' 10"  (1.778 m), weight 186 lb (84.4 kg).  Constitutional: overall normal hygiene, normal nutrition, well developed, normal grooming, normal body habitus. Assistive device:none  Musculoskeletal: gait and station Limp none, muscle tone and strength are normal, no tremors or atrophy is present.  .  Neurological: coordination overall normal.  Deep tendon reflex/nerve stretch intact.  Sensation normal.  Cranial nerves II-XII intact.   Skin:   Normal overall no scars, lesions, ulcers or rashes. No psoriasis.  Psychiatric: Alert and oriented x 3.  Recent memory intact, remote memory unclear.  Normal mood and affect. Well groomed.  Good eye contact.  Cardiovascular: overall no swelling, no varicosities, no edema bilaterally, normal temperatures of the legs and arms, no clubbing, cyanosis and good capillary refill.  Lymphatic: palpation is normal.  Spine/Pelvis examination:  Inspection:  Overall, sacoiliac joint benign and hips nontender; without crepitus or defects.   Thoracic spine inspection: Alignment normal without kyphosis present   Lumbar spine inspection:  Alignment  with normal lumbar lordosis, without scoliosis apparent.   Thoracic spine palpation:  without tenderness of spinal processes   Lumbar spine palpation: without tenderness of lumbar area; without tightness of lumbar muscles    Range of Motion:   Lumbar flexion, forward  flexion is full without pain or tenderness    Lumbar extension is full without pain or tenderness   Left lateral bend is Normal  without pain or tenderness   Right lateral bend is Normal without pain or tenderness   Straight leg raising is Normal   Strength & tone: Normal   Stability overall normal stability   He continues to smoke and is considering cutting back.  The patient has  been educated about the nature of the problem(s) and counseled on treatment options.  The patient appeared to understand what I have discussed and is in agreement with it.  Encounter Diagnoses  Name Primary?  . Right-sided low back pain with right-sided sciatica Yes  . Tobacco smoker within last 12 months     PLAN Call if any problems.  Precautions discussed.  Continue current medications.   Return to clinic prn   Electronically Signed Darreld Mclean, MD 9/7/20178:55 AM

## 2016-03-26 NOTE — Patient Instructions (Addendum)

## 2016-03-30 ENCOUNTER — Telehealth: Payer: Self-pay | Admitting: Orthopaedic Surgery

## 2016-03-30 MED ORDER — OXYCODONE-ACETAMINOPHEN 5-325 MG PO TABS: 1.0000 | ORAL_TABLET | Freq: Four times a day (QID) | ORAL | 0 refills | Status: DC | PRN

## 2016-03-30 NOTE — Telephone Encounter (Signed)
Patient called for refill:  oxyCODONE-acetaminophen (PERCOCET/ROXICET) 5-325 MG tablet 60 tablet 03/17/2016  - quantity 60 / Insurance: [employer]

## 2016-03-31 ENCOUNTER — Encounter: Payer: Self-pay | Admitting: Orthopaedic Surgery

## 2016-04-10 ENCOUNTER — Telehealth: Payer: Self-pay | Admitting: Orthopaedic Surgery

## 2016-04-13 ENCOUNTER — Other Ambulatory Visit: Payer: Self-pay | Admitting: Orthopaedic Surgery

## 2016-04-13 MED ORDER — OXYCODONE-ACETAMINOPHEN 5-325 MG PO TABS: 1.0000 | ORAL_TABLET | Freq: Four times a day (QID) | ORAL | 0 refills | Status: DC | PRN

## 2016-04-16 ENCOUNTER — Ambulatory Visit (INDEPENDENT_AMBULATORY_CARE_PROVIDER_SITE_OTHER): Payer: BLUE CROSS/BLUE SHIELD | Admitting: Orthopaedic Surgery

## 2016-04-16 ENCOUNTER — Encounter: Payer: Self-pay | Admitting: Orthopaedic Surgery

## 2016-04-16 VITALS — BP 154/108 | HR 88 | Temp 97.9°F | Ht 70.5 in | Wt 190.0 lb

## 2016-04-16 DIAGNOSIS — Z720 Tobacco use: Secondary | ICD-10-CM

## 2016-04-16 DIAGNOSIS — M5441 Lumbago with sciatica, right side: Secondary | ICD-10-CM

## 2016-04-16 DIAGNOSIS — F17200 Nicotine dependence, unspecified, uncomplicated: Secondary | ICD-10-CM

## 2016-04-16 NOTE — Progress Notes (Signed)
Patient Cameron [name], male DOB:1965/10/05, 50 y.o. [id number]  Chief Complaint  Patient presents with  . Follow-up    right hip pain, right knee pain    HPI  Cameron [name] is a 50 y.o. male who has lower back pain with right sided sciatica.  He has good and bad days.  Some days he has to walk around to be better first thing in the morning. 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  He has no trauma.  He has no weakness and no bowel or bladder problems.  He is on diclofenac and it helps a lot.    I spent some time talking to him and his wife about his problem and gave suggestions.  I have told him to do his back exercises daily.  I will hold off on x-rays or MRI now depending on how he is doing.  He is agreeable to this.  I told him the weather may bother him and to make note of it. HPI  Body mass index is 26.88 kg/m.  ROS  Review of Systems  HENT: Negative for congestion.   Respiratory: Negative for cough and shortness of breath.   Cardiovascular: Negative for chest pain and leg swelling.  Endocrine: Negative for cold intolerance.  Musculoskeletal: Positive for arthralgias and back pain.  Allergic/Immunologic: Negative for environmental allergies.    Past Medical History:  Diagnosis Date  . Cancer (HCC)     No past surgical history on file.  No family history on file.  Social History Social History  Substance Use Topics  . Smoking status: Current Every Day Smoker  . Smokeless tobacco: Never Used  . Alcohol use Yes    No Known Allergies  Current Outpatient Prescriptions  Medication Sig Dispense Refill  . diclofenac (VOLTAREN) 75 MG EC tablet Take 1 tablet (75 mg total) by mouth 2 (two) times daily with a meal. 60 tablet 2  . lisinopril (PRINIVIL,ZESTRIL) 30 MG tablet Take 30 mg by mouth daily.    Marland Kitchen oxyCODONE-acetaminophen (PERCOCET/ROXICET) 5-325 MG tablet Take 1 tablet by mouth every 6 (six) hours as needed for moderate pain or severe pain  (Must last 14 days.Do not drive or operate machinery while taking this medicine). 45 tablet 0  . predniSONE (STERAPRED UNI-PAK 21 TAB) 5 MG (21) TBPK tablet Take 6 pills first day; 5 pills second day; 4 pills third day; 3 pills fourth day; 2 pills next day and 1 pill last day. 21 tablet 0  . tiZANidine (ZANAFLEX) 4 MG tablet One by mouth every 8 hours as needed for spasm 40 tablet 3   No current facility-administered medications for this visit.      Physical Exam  Blood pressure (!) 154/108, pulse 88, temperature 97.9 F (36.6 C), height 5' 10.5" (1.791 m), weight 190 lb (86.2 kg).  Constitutional: overall normal hygiene, normal nutrition, well developed, normal grooming, normal body habitus. Assistive device:none  Musculoskeletal: gait and station Limp none, muscle tone and strength are normal, no tremors or atrophy is present.  .  Neurological: coordination overall normal.  Deep tendon reflex/nerve stretch intact.  Sensation normal.  Cranial nerves II-XII intact.   Skin:   Normal overall no scars, lesions, ulcers or rashes. No psoriasis.  Psychiatric: Alert and oriented x 3.  Recent memory intact, remote memory unclear.  Normal mood and affect. Well groomed.  Good eye contact.  Cardiovascular: overall no swelling, no varicosities, no edema bilaterally, normal  temperatures of the legs and arms, no clubbing, cyanosis and good capillary refill.  Lymphatic: palpation is normal.  Spine/Pelvis examination:  Inspection:  Overall, sacoiliac joint benign and hips nontender; without crepitus or defects.   Thoracic spine inspection: Alignment normal without kyphosis present   Lumbar spine inspection:  Alignment  with normal lumbar lordosis, without scoliosis apparent.   Thoracic spine palpation:  without tenderness of spinal processes   Lumbar spine palpation: with tenderness of lumbar area; without tightness of lumbar muscles    Range of Motion:   Lumbar flexion, forward flexion is  45 without pain or tenderness    Lumbar extension is full without pain or tenderness   Left lateral bend is Normal  without pain or tenderness   Right lateral bend is Normal without pain or tenderness   Straight leg raising is Normal   Strength & tone: Normal   Stability overall normal stability     The patient has been educated about the nature of the problem(s) and counseled on treatment options.  The patient appeared to understand what I have discussed and is in agreement with it.  Encounter Diagnoses  Name Primary?  . Right-sided low back pain with right-sided sciatica Yes  . Tobacco smoker within last 12 months     PLAN Call if any problems.  Precautions discussed.  Continue current medications.   Return to clinic 6 weeks   Electronically Signed Darreld McleanWayne Izaiyah Kleinman, MD 9/28/201711:28 AM

## 2016-04-16 NOTE — Patient Instructions (Addendum)
Smoking Cessation, Tips for Success If you are ready to quit smoking, congratulations! You have chosen to help yourself be healthier. Cigarettes bring nicotine, tar, carbon monoxide, and other irritants into your body. Your lungs, heart, and blood vessels will be able to work better without these poisons. There are many different ways to quit smoking. Nicotine gum, nicotine patches, a nicotine inhaler, or nicotine nasal spray can help with physical craving. Hypnosis, support groups, and medicines help break the habit of smoking. WHAT THINGS CAN I DO TO MAKE QUITTING EASIER?  Here are some tips to help you quit for good:  Pick a date when you will quit smoking completely. Tell all of your friends and family about your plan to quit on that date.  Do not try to slowly cut down on the number of cigarettes you are smoking. Pick a quit date and quit smoking completely starting on that day.  Throw away all cigarettes.   Clean and remove all ashtrays from your home, work, and car.  On a card, write down your reasons for quitting. Carry the card with you and read it when you get the urge to smoke.  Cleanse your body of nicotine. Drink enough water and fluids to keep your urine clear or pale yellow. Do this after quitting to flush the nicotine from your body.  Learn to predict your moods. Do not let a bad situation be your excuse to have a cigarette. Some situations in your life might tempt you into wanting a cigarette.  Never have "just one" cigarette. It leads to wanting another and another. Remind yourself of your decision to quit.  Change habits associated with smoking. If you smoked while driving or when feeling stressed, try other activities to replace smoking. Stand up when drinking your coffee. Brush your teeth after eating. Sit in a different chair when you read the paper. Avoid alcohol while trying to quit, and try to drink fewer caffeinated beverages. Alcohol and caffeine may urge you to  smoke.  Avoid foods and drinks that can trigger a desire to smoke, such as sugary or spicy foods and alcohol.  Ask people who smoke not to smoke around you.  Have something planned to do right after eating or having a cup of coffee. For example, plan to take a walk or exercise.  Try a relaxation exercise to calm you down and decrease your stress. Remember, you may be tense and nervous for the first 2 weeks after you quit, but this will pass.  Find new activities to keep your hands busy. Play with a pen, coin, or rubber band. Doodle or draw things on paper.  Brush your teeth right after eating. This will help cut down on the craving for the taste of tobacco after meals. You can also try mouthwash.   Use oral substitutes in place of cigarettes. Try using lemon drops, carrots, cinnamon sticks, or chewing gum. Keep them handy so they are available when you have the urge to smoke.  When you have the urge to smoke, try deep breathing.  Designate your home as a nonsmoking area.  If you are a heavy smoker, ask your health care provider about a prescription for nicotine chewing gum. It can ease your withdrawal from nicotine.  Reward yourself. Set aside the cigarette money you save and buy yourself something nice.  Look for support from others. Join a support group or smoking cessation program. Ask someone at home or at work to help you with your plan   to quit smoking.  Always ask yourself, "Do I need this cigarette or is this just a reflex?" Tell yourself, "Today, I choose not to smoke," or "I do not want to smoke." You are reminding yourself of your decision to quit.  Do not replace cigarette smoking with electronic cigarettes (commonly called e-cigarettes). The safety of e-cigarettes is unknown, and some may contain harmful chemicals.  If you relapse, do not give up! Plan ahead and think about what you will do the next time you get the urge to smoke. HOW WILL I FEEL WHEN I QUIT SMOKING? You  may have symptoms of withdrawal because your body is used to nicotine (the addictive substance in cigarettes). You may crave cigarettes, be irritable, feel very hungry, cough often, get headaches, or have difficulty concentrating. The withdrawal symptoms are only temporary. They are strongest when you first quit but will go away within 10-14 days. When withdrawal symptoms occur, stay in control. Think about your reasons for quitting. Remind yourself that these are signs that your body is healing and getting used to being without cigarettes. Remember that withdrawal symptoms are easier to treat than the major diseases that smoking can cause.  Even after the withdrawal is over, expect periodic urges to smoke. However, these cravings are generally short lived and will go away whether you smoke or not. Do not smoke! WHAT RESOURCES ARE AVAILABLE TO HELP ME QUIT SMOKING? Your health care provider can direct you to community resources or hospitals for support, which may include:  Group support.  Education.  Hypnosis.  Therapy.   This information is not intended to replace advice given to you by your health care provider. Make sure you discuss any questions you have with your health care provider.   Document Released: 04/03/2004 Document Revised: 07/27/2014 Document Reviewed: 12/22/2012 Elsevier Interactive Patient Education 2016 Elsevier Inc.  Back Exercises If you have pain in your back, do these exercises 2-3 times each day or as told by your doctor. When the pain goes away, do the exercises once each day, but repeat the steps more times for each exercise (do more repetitions). If you do not have pain in your back, do these exercises once each day or as told by your doctor. EXERCISES Single Knee to Chest Do these steps 3-5 times in a row for each leg: 1. Lie on your back on a firm bed or the floor with your legs stretched out. 2. Bring one knee to your chest. 3. Hold your knee to your chest by  grabbing your knee or thigh. 4. Pull on your knee until you feel a gentle stretch in your lower back. 5. Keep doing the stretch for 10-30 seconds. 6. Slowly let go of your leg and straighten it. Pelvic Tilt Do these steps 5-10 times in a row: 1. Lie on your back on a firm bed or the floor with your legs stretched out. 2. Bend your knees so they point up to the ceiling. Your feet should be flat on the floor. 3. Tighten your lower belly (abdomen) muscles to press your lower back against the floor. This will make your tailbone point up to the ceiling instead of pointing down to your feet or the floor. 4. Stay in this position for 5-10 seconds while you gently tighten your muscles and breathe evenly. Cat-Cow Do these steps until your lower back bends more easily: 1. Get on your hands and knees on a firm surface. Keep your hands under your shoulders, and keep  your knees under your hips. You may put padding under your knees. 2. Let your head hang down, and make your tailbone point down to the floor so your lower back is round like the back of a cat. 3. Stay in this position for 5 seconds. 4. Slowly lift your head and make your tailbone point up to the ceiling so your back hangs low (sags) like the back of a cow. 5. Stay in this position for 5 seconds. Press-Ups Do these steps 5-10 times in a row: 1. Lie on your belly (face-down) on the floor. 2. Place your hands near your head, about shoulder-width apart. 3. While you keep your back relaxed and keep your hips on the floor, slowly straighten your arms to raise the top half of your body and lift your shoulders. Do not use your back muscles. To make yourself more comfortable, you may change where you place your hands. 4. Stay in this position for 5 seconds. 5. Slowly return to lying flat on the floor. Bridges Do these steps 10 times in a row: 1. Lie on your back on a firm surface. 2. Bend your knees so they point up to the ceiling. Your feet should  be flat on the floor. 3. Tighten your butt muscles and lift your butt off of the floor until your waist is almost as high as your knees. If you do not feel the muscles working in your butt and the back of your thighs, slide your feet 1-2 inches farther away from your butt. 4. Stay in this position for 3-5 seconds. 5. Slowly lower your butt to the floor, and let your butt muscles relax. If this exercise is too easy, try doing it with your arms crossed over your chest. Belly Crunches Do these steps 5-10 times in a row: 1. Lie on your back on a firm bed or the floor with your legs stretched out. 2. Bend your knees so they point up to the ceiling. Your feet should be flat on the floor. 3. Cross your arms over your chest. 4. Tip your chin a little bit toward your chest but do not bend your neck. 5. Tighten your belly muscles and slowly raise your chest just enough to lift your shoulder blades a tiny bit off of the floor. 6. Slowly lower your chest and your head to the floor. Back Lifts Do these steps 5-10 times in a row: 1. Lie on your belly (face-down) with your arms at your sides, and rest your forehead on the floor. 2. Tighten the muscles in your legs and your butt. 3. Slowly lift your chest off of the floor while you keep your hips on the floor. Keep the back of your head in line with the curve in your back. Look at the floor while you do this. 4. Stay in this position for 3-5 seconds. 5. Slowly lower your chest and your face to the floor. GET HELP IF:  Your back pain gets a lot worse when you do an exercise.  Your back pain does not lessen 2 hours after you exercise. If you have any of these problems, stop doing the exercises. Do not do them again unless your doctor says it is okay. GET HELP RIGHT AWAY IF:  You have sudden, very bad back pain. If this happens, stop doing the exercises. Do not do them again unless your doctor says it is okay.   This information is not intended to replace  advice given to you by  your health care provider. Make sure you discuss any questions you have with your health care provider.   Document Released: 08/08/2010 Document Revised: 03/27/2015 Document Reviewed: 08/30/2014 Elsevier Interactive Patient Education [employer] Inc.

## 2016-04-25 ENCOUNTER — Telehealth: Payer: Self-pay | Admitting: Orthopaedic Surgery

## 2016-04-27 MED ORDER — OXYCODONE-ACETAMINOPHEN 5-325 MG PO TABS: 1.0000 | ORAL_TABLET | Freq: Four times a day (QID) | ORAL | 0 refills | Status: DC | PRN

## 2016-05-25 ENCOUNTER — Telehealth: Payer: Self-pay | Admitting: Orthopaedic Surgery

## 2016-05-26 MED ORDER — OXYCODONE-ACETAMINOPHEN 5-325 MG PO TABS: 1.0000 | ORAL_TABLET | Freq: Four times a day (QID) | ORAL | 0 refills | Status: DC | PRN

## 2016-06-25 ENCOUNTER — Encounter: Payer: Self-pay | Admitting: Orthopaedic Surgery

## 2016-06-25 ENCOUNTER — Ambulatory Visit (INDEPENDENT_AMBULATORY_CARE_PROVIDER_SITE_OTHER): Payer: BLUE CROSS/BLUE SHIELD | Admitting: Orthopaedic Surgery

## 2016-06-25 VITALS — BP 137/94 | HR 88 | Temp 97.2°F | Ht 71.0 in | Wt 197.0 lb

## 2016-06-25 DIAGNOSIS — G8929 Other chronic pain: Secondary | ICD-10-CM

## 2016-06-25 DIAGNOSIS — M5441 Lumbago with sciatica, right side: Secondary | ICD-10-CM

## 2016-06-25 DIAGNOSIS — F17210 Nicotine dependence, cigarettes, uncomplicated: Secondary | ICD-10-CM

## 2016-06-25 MED ORDER — OXYCODONE-ACETAMINOPHEN 5-325 MG PO TABS: 1.0000 | ORAL_TABLET | Freq: Four times a day (QID) | ORAL | 0 refills | Status: DC | PRN

## 2016-06-25 NOTE — Patient Instructions (Signed)
Steps to Quit Smoking Smoking tobacco can be bad for your health. It can also affect almost every organ in your body. Smoking puts you and people around you at risk for many serious long-lasting (chronic) diseases. Quitting smoking is hard, but it is one of the best things that you can do for your health. It is never too late to quit. What are the benefits of quitting smoking? When you quit smoking, you lower your risk for getting serious diseases and conditions. They can include:  Lung cancer or lung disease.  Heart disease.  Stroke.  Heart attack.  Not being able to have children (infertility).  Weak bones (osteoporosis) and broken bones (fractures). If you have coughing, wheezing, and shortness of breath, those symptoms may get better when you quit. You may also get sick less often. If you are pregnant, quitting smoking can help to lower your chances of having a baby of low birth weight. What can I do to help me quit smoking? Talk with your doctor about what can help you quit smoking. Some things you can do (strategies) include:  Quitting smoking totally, instead of slowly cutting back how much you smoke over a period of time.  Going to in-person counseling. You are more likely to quit if you go to many counseling sessions.  Using resources and support systems, such as:  Online chats with a counselor.  Phone quitlines.  Printed self-help materials.  Support groups or group counseling.  Text messaging programs.  Mobile phone apps or applications.  Taking medicines. Some of these medicines may have nicotine in them. If you are pregnant or breastfeeding, do not take any medicines to quit smoking unless your doctor says it is okay. Talk with your doctor about counseling or other things that can help you. Talk with your doctor about using more than one strategy at the same time, such as taking medicines while you are also going to in-person counseling. This can help make quitting  easier. What things can I do to make it easier to quit? Quitting smoking might feel very hard at first, but there is a lot that you can do to make it easier. Take these steps:  Talk to your family and friends. Ask them to support and encourage you.  Call phone quitlines, reach out to support groups, or work with a counselor.  Ask people who smoke to not smoke around you.  Avoid places that make you want (trigger) to smoke, such as:  Bars.  Parties.  Smoke-break areas at work.  Spend time with people who do not smoke.  Lower the stress in your life. Stress can make you want to smoke. Try these things to help your stress:  Getting regular exercise.  Deep-breathing exercises.  Yoga.  Meditating.  Doing a body scan. To do this, close your eyes, focus on one area of your body at a time from head to toe, and notice which parts of your body are tense. Try to relax the muscles in those areas.  Download or buy apps on your mobile phone or tablet that can help you stick to your quit plan. There are many free apps, such as QuitGuide from the CDC (Centers for Disease Control and Prevention). You can find more support from smokefree.gov and other websites. This information is not intended to replace advice given to you by your health care provider. Make sure you discuss any questions you have with your health care provider. Document Released: 05/02/2009 Document Revised: 03/03/2016 Document   Reviewed: 11/20/2014 Elsevier Interactive Patient Education  2017 Elsevier Inc.  

## 2016-06-25 NOTE — Progress Notes (Signed)
Patient ID:Cameron [name], male DOB:05-02-1966, 50 y.o. [id number]  Chief Complaint  Patient presents with  . Follow-up    right hip, right knee    HPI  Solon AugustaRichard E [name] is a 50 y.o. male who has chronic pain of the lumbar spine with some right sided sciatica.  He has less paresthesias recently. He is active and is doing his exercises.  He has no new trauma.  HPI  Body mass index is 27.48 kg/m.  ROS  Review of Systems  HENT: Negative for congestion.   Respiratory: Negative for cough and shortness of breath.   Cardiovascular: Negative for chest pain and leg swelling.  Endocrine: Negative for cold intolerance.  Musculoskeletal: Positive for arthralgias and back pain.  Allergic/Immunologic: Negative for environmental allergies.    Past Medical History:  Diagnosis Date  . Cancer (HCC)     No past surgical history on file.  No family history on file.  Social History Social History  Substance Use Topics  . Smoking status: Current Every Day Smoker  . Smokeless tobacco: Never Used  . Alcohol use Yes    No Known Allergies  Current Outpatient Prescriptions  Medication Sig Dispense Refill  . diclofenac (VOLTAREN) 75 MG EC tablet Take 1 tablet (75 mg total) by mouth 2 (two) times daily with a meal. 60 tablet 2  . lisinopril (PRINIVIL,ZESTRIL) 30 MG tablet Take 30 mg by mouth daily.    Marland Kitchen. oxyCODONE-acetaminophen (PERCOCET/ROXICET) 5-325 MG tablet Take 1 tablet by mouth every 6 (six) hours as needed for moderate pain or severe pain (Must last 30 days.Do not drive or operate machinery while taking this medicine). 70 tablet 0  . predniSONE (STERAPRED UNI-PAK 21 TAB) 5 MG (21) TBPK tablet Take 6 pills first day; 5 pills second day; 4 pills third day; 3 pills fourth day; 2 pills next day and 1 pill last day. 21 tablet 0  . tiZANidine (ZANAFLEX) 4 MG tablet One by mouth every 8 hours as needed for spasm 40 tablet 3   No current facility-administered medications for this visit.       Physical Exam  Blood pressure (!) 137/94, pulse 88, temperature 97.2 F (36.2 C), height 5\' 11"  (1.803 m), weight 197 lb (89.4 kg).  Constitutional: overall normal hygiene, normal nutrition, well developed, normal grooming, normal body habitus. Assistive device:none  Musculoskeletal: gait and station Limp none, muscle tone and strength are normal, no tremors or atrophy is present.  .  Neurological: coordination overall normal.  Deep tendon reflex/nerve stretch intact.  Sensation normal.  Cranial nerves II-XII intact.   Skin:   Normal overall no scars, lesions, ulcers or rashes. No psoriasis.  Psychiatric: Alert and oriented x 3.  Recent memory intact, remote memory unclear.  Normal mood and affect. Well groomed.  Good eye contact.  Cardiovascular: overall no swelling, no varicosities, no edema bilaterally, normal temperatures of the legs and arms, no clubbing, cyanosis and good capillary refill.  Lymphatic: palpation is normal.   Spine/Pelvis examination:  Inspection:  Overall, sacoiliac joint benign and hips nontender; without crepitus or defects.   Thoracic spine inspection: Alignment normal without kyphosis present   Lumbar spine inspection:  Alignment  with normal lumbar lordosis, without scoliosis apparent.   Thoracic spine palpation:  without tenderness of spinal processes   Lumbar spine palpation: with tenderness of lumbar area; without tightness of lumbar muscles    Range of Motion:   Lumbar flexion, forward flexion is 45 without pain or tenderness  Lumbar extension is full without pain or tenderness   Left lateral bend is Normal  without pain or tenderness   Right lateral bend is Normal without pain or tenderness   Straight leg raising is Normal   Strength & tone: Normal   Stability overall normal stability    The patient has been educated about the nature of the problem(s) and counseled on treatment options.  The patient appeared to understand what I  have discussed and is in agreement with it.  Encounter Diagnoses  Name Primary?  . Chronic right-sided low back pain with right-sided sciatica Yes  . Cigarette nicotine dependence without complication     PLAN Call if any problems.  Precautions discussed.  Continue current medications.   Return to clinic 3 months   Electronically Signed Darreld McleanWayne Kamau Weatherall, MD 12/7/20179:50 AM

## 2016-07-23 ENCOUNTER — Telehealth: Payer: Self-pay | Admitting: Orthopaedic Surgery

## 2016-07-27 MED ORDER — OXYCODONE-ACETAMINOPHEN 5-325 MG PO TABS: 1.0000 | ORAL_TABLET | Freq: Four times a day (QID) | ORAL | 0 refills | Status: DC | PRN

## 2016-08-25 ENCOUNTER — Other Ambulatory Visit: Payer: Self-pay | Admitting: Orthopaedic Surgery

## 2016-08-25 MED ORDER — OXYCODONE-ACETAMINOPHEN 5-325 MG PO TABS: 1.0000 | ORAL_TABLET | Freq: Four times a day (QID) | ORAL | 0 refills | Status: DC | PRN

## 2016-09-28 ENCOUNTER — Telehealth: Payer: Self-pay | Admitting: Orthopaedic Surgery

## 2016-09-29 ENCOUNTER — Ambulatory Visit: Payer: BLUE CROSS/BLUE SHIELD | Admitting: Orthopaedic Surgery

## 2016-09-29 MED ORDER — HYDROCODONE-ACETAMINOPHEN 7.5-325 MG PO TABS: 1.0000 | ORAL_TABLET | Freq: Four times a day (QID) | ORAL | 0 refills | Status: DC | PRN

## 2016-10-06 ENCOUNTER — Ambulatory Visit (INDEPENDENT_AMBULATORY_CARE_PROVIDER_SITE_OTHER): Payer: BLUE CROSS/BLUE SHIELD | Admitting: Orthopaedic Surgery

## 2016-10-06 VITALS — BP 165/108 | HR 95 | Temp 98.1°F | Ht 71.0 in | Wt 203.0 lb

## 2016-10-06 DIAGNOSIS — G8929 Other chronic pain: Secondary | ICD-10-CM | POA: Diagnosis not present

## 2016-10-06 DIAGNOSIS — F17210 Nicotine dependence, cigarettes, uncomplicated: Secondary | ICD-10-CM

## 2016-10-06 DIAGNOSIS — M5441 Lumbago with sciatica, right side: Secondary | ICD-10-CM | POA: Diagnosis not present

## 2016-10-06 NOTE — Progress Notes (Signed)
Patient [name]:Cameron [name], male DOB:1966-06-18, 51 y.o. [id number]  Chief Complaint  Patient presents with  . Follow-up    Chronic pain    HPI  Cameron [name] is a 51 y.o. male who has chronic lower back pain with some right sided paresthesias at times.  He is stable.  Cold rainy weather, like today, makes his pain worse.  He has no new acute episodes. He has no weakness or bowel or bladder problems. He is active and doing his exercises.  I recently changed his pain medicine. HPI  Body mass index is 28.31 kg/m.  ROS  Review of Systems  HENT: Negative for congestion.   Respiratory: Negative for cough and shortness of breath.   Cardiovascular: Negative for chest pain and leg swelling.  Endocrine: Negative for cold intolerance.  Musculoskeletal: Positive for arthralgias and back pain.  Allergic/Immunologic: Negative for environmental allergies.    Past Medical History:  Diagnosis Date  . Cancer (HCC)     No past surgical history on file.  No family history on file.  Social History Social History  Substance Use Topics  . Smoking status: Current Every Day Smoker  . Smokeless tobacco: Never Used  . Alcohol use Yes    No Known Allergies  Current Outpatient Prescriptions  Medication Sig Dispense Refill  . diclofenac (VOLTAREN) 75 MG EC tablet Take 1 tablet (75 mg total) by mouth 2 (two) times daily with a meal. 60 tablet 2  . HYDROcodone-acetaminophen (NORCO) 7.5-325 MG tablet Take 1 tablet by mouth every 6 (six) hours as needed for moderate pain (Must last 30 days.Do not drive a call or operate machinery while on this medicine.). 60 tablet 0  . lisinopril (PRINIVIL,ZESTRIL) 30 MG tablet Take 30 mg by mouth daily.    . predniSONE (STERAPRED UNI-PAK 21 TAB) 5 MG (21) TBPK tablet Take 6 pills first day; 5 pills second day; 4 pills third day; 3 pills fourth day; 2 pills next day and 1 pill last day. 21 tablet 0  . tiZANidine (ZANAFLEX) 4 MG tablet One by mouth every 8  hours as needed for spasm 40 tablet 3   No current facility-administered medications for this visit.      Physical Exam  Blood pressure (!) 165/108, pulse 95, temperature 98.1 F (36.7 C), height 5\' 11"  (1.803 m), weight 203 lb (92.1 kg).  Constitutional: overall normal hygiene, normal nutrition, well developed, normal grooming, normal body habitus. Assistive device:none  Musculoskeletal: gait and station Limp none, muscle tone and strength are normal, no tremors or atrophy is present.  .  Neurological: coordination overall normal.  Deep tendon reflex/nerve stretch intact.  Sensation normal.  Cranial nerves II-XII intact.   Skin:   Normal overall no scars, lesions, ulcers or rashes. No psoriasis.  Psychiatric: Alert and oriented x 3.  Recent memory intact, remote memory unclear.  Normal mood and affect. Well groomed.  Good eye contact.  Cardiovascular: overall no swelling, no varicosities, no edema bilaterally, normal temperatures of the legs and arms, no clubbing, cyanosis and good capillary refill.  Lymphatic: palpation is normal.  Spine/Pelvis examination:  Inspection:  Overall, sacoiliac joint benign and hips nontender; without crepitus or defects.   Thoracic spine inspection: Alignment normal without kyphosis present   Lumbar spine inspection:  Alignment  with normal lumbar lordosis, without scoliosis apparent.   Thoracic spine palpation:  without tenderness of spinal processes   Lumbar spine palpation: with tenderness of lumbar area; without tightness of lumbar muscles  Range of Motion:   Lumbar flexion, forward flexion is 50 without pain or tenderness    Lumbar extension is full without pain or tenderness   Left lateral bend is Normal  without pain or tenderness   Right lateral bend is Normal without pain or tenderness   Straight leg raising is Normal   Strength & tone: Normal   Stability overall normal stability     The patient has been educated about the  nature of the problem(s) and counseled on treatment options.  The patient appeared to understand what I have discussed and is in agreement with it.  He continues to smoke and is willing to cut back.  He will contact his family doctor.  Encounter Diagnoses  Name Primary?  . Chronic right-sided low back pain with right-sided sciatica Yes  . Cigarette nicotine dependence without complication     PLAN Call if any problems.  Precautions discussed.  Continue current medications.   Return to clinic 3 months   Electronically Signed Darreld Mclean, MD 3/20/20189:58 AM

## 2016-10-21 ENCOUNTER — Other Ambulatory Visit: Payer: Self-pay | Admitting: Orthopaedic Surgery

## 2016-10-28 ENCOUNTER — Telehealth: Payer: Self-pay | Admitting: Orthopaedic Surgery

## 2016-10-28 MED ORDER — HYDROCODONE-ACETAMINOPHEN 7.5-325 MG PO TABS: 1.0000 | ORAL_TABLET | Freq: Four times a day (QID) | ORAL | 0 refills | Status: DC | PRN

## 2016-10-28 NOTE — Telephone Encounter (Signed)
Patient requests refill on Hydrocodone/Acetaminophen 7.5-325  mgs.   Qty  60  Sig: Take 1 tablet by mouth every 6 (six) hours as needed for moderate pain (Must last 30 days.Do not drive a call or operate machinery while on this medicine.).   (Pt states that you were going to increase quantity because of recent change and that you told him to call in for prescription and make sure to remind you of this)

## 2016-11-23 ENCOUNTER — Telehealth: Payer: Self-pay | Admitting: Orthopaedic Surgery

## 2016-11-24 MED ORDER — HYDROCODONE-ACETAMINOPHEN 7.5-325 MG PO TABS: 1.0000 | ORAL_TABLET | Freq: Four times a day (QID) | ORAL | 0 refills | Status: DC | PRN

## 2016-12-22 ENCOUNTER — Telehealth: Payer: Self-pay | Admitting: Orthopaedic Surgery

## 2016-12-22 MED ORDER — HYDROCODONE-ACETAMINOPHEN 7.5-325 MG PO TABS
1.0000 | ORAL_TABLET | Freq: Four times a day (QID) | ORAL | 0 refills | Status: DC | PRN
Start: 2016-12-22 — End: 2017-01-06

## 2017-01-06 ENCOUNTER — Encounter: Payer: Self-pay | Admitting: Orthopaedic Surgery

## 2017-01-06 ENCOUNTER — Ambulatory Visit (INDEPENDENT_AMBULATORY_CARE_PROVIDER_SITE_OTHER): Payer: BLUE CROSS/BLUE SHIELD | Admitting: Orthopaedic Surgery

## 2017-01-06 VITALS — BP 157/102 | HR 101 | Ht 71.0 in | Wt 190.0 lb

## 2017-01-06 DIAGNOSIS — F17210 Nicotine dependence, cigarettes, uncomplicated: Secondary | ICD-10-CM

## 2017-01-06 DIAGNOSIS — M5441 Lumbago with sciatica, right side: Secondary | ICD-10-CM

## 2017-01-06 DIAGNOSIS — G8929 Other chronic pain: Secondary | ICD-10-CM | POA: Diagnosis not present

## 2017-01-06 MED ORDER — HYDROCODONE-ACETAMINOPHEN 7.5-325 MG PO TABS: 1.0000 | ORAL_TABLET | Freq: Four times a day (QID) | ORAL | 0 refills | Status: DC | PRN

## 2017-01-06 NOTE — Progress Notes (Signed)
Patient Cameron [name], male DOB:[birth year], 51 y.o. [id number]  Chief Complaint  Patient presents with  . Follow-up    Back    HPI  Cameron [name] is a 51 y.o. male who has chronic lower back pain.  He has good and bad days.  He has some right sided sciatica.  He has no new trauma, no weakness. He is doing his exercises.  He still smokes and is unable to cut back. HPI  Body mass index is 26.5 kg/m.  ROS  Review of Systems  HENT: Negative for congestion.   Respiratory: Negative for cough and shortness of breath.   Cardiovascular: Negative for chest pain and leg swelling.  Endocrine: Negative for cold intolerance.  Musculoskeletal: Positive for arthralgias and back pain.  Allergic/Immunologic: Negative for environmental allergies.    Past Medical History:  Diagnosis Date  . Cancer [facility])     History reviewed. No pertinent surgical history.  History reviewed. No pertinent family history.  Social History Social History  Substance Use Topics  . Smoking status: Current Every Day Smoker  . Smokeless tobacco: Never Used  . Alcohol use Yes    No Known Allergies  Current Outpatient Prescriptions  Medication Sig Dispense Refill  . diclofenac (VOLTAREN) 75 MG EC tablet Take 1 tablet (75 mg total) by mouth 2 (two) times daily with a meal. 60 tablet 2  . HYDROcodone-acetaminophen (NORCO) 7.5-325 MG tablet Take 1 tablet by mouth every 6 (six) hours as needed for moderate pain (Must last 30 days.Do not drive a call or operate machinery while on this medicine.). 85 tablet 0  . lisinopril (PRINIVIL,ZESTRIL) 30 MG tablet Take 30 mg by mouth daily.    . predniSONE (STERAPRED UNI-PAK 21 TAB) 5 MG (21) TBPK tablet Take 6 pills first day; 5 pills second day; 4 pills third day; 3 pills fourth day; 2 pills next day and 1 pill last day. 21 tablet 0  . tiZANidine (ZANAFLEX) 4 MG tablet One by mouth every 8 hours as needed for spasm 40 tablet 3   No current facility-administered  medications for this visit.      Physical Exam  Blood pressure (!) 157/102, pulse (!) 101, height 5\' 11"  (1.803 m), weight 190 lb (86.2 kg).  Constitutional: overall normal hygiene, normal nutrition, well developed, normal grooming, normal body habitus. Assistive device:none  Musculoskeletal: gait and station Limp none, muscle tone and strength are normal, no tremors or atrophy is present.  .  Neurological: coordination overall normal.  Deep tendon reflex/nerve stretch intact.  Sensation normal.  Cranial nerves II-XII intact.   Skin:   Normal overall no scars, lesions, ulcers or rashes. No psoriasis.  Psychiatric: Alert and oriented x 3.  Recent memory intact, remote memory unclear.  Normal mood and affect. Well groomed.  Good eye contact.  Cardiovascular: overall no swelling, no varicosities, no edema bilaterally, normal temperatures of the legs and arms, no clubbing, cyanosis and good capillary refill.  Lymphatic: palpation is normal.  Spine/Pelvis examination:  Inspection:  Overall, sacoiliac joint benign and hips nontender; without crepitus or defects.   Thoracic spine inspection: Alignment normal without kyphosis present   Lumbar spine inspection:  Alignment  with normal lumbar lordosis, without scoliosis apparent.   Thoracic spine palpation:  without tenderness of spinal processes   Lumbar spine palpation: with tenderness of lumbar area; without tightness of lumbar muscles    Range of Motion:   Lumbar flexion, forward flexion is 40 without pain or tenderness  Lumbar extension is 10 without pain or tenderness   Left lateral bend is Normal  without pain or tenderness   Right lateral bend is Normal without pain or tenderness   Straight leg raising is Normal   Strength & tone: Normal   Stability overall normal stability     The patient has been educated about the nature of the problem(s) and counseled on treatment options.  The patient appeared to understand what I  have discussed and is in agreement with it.  Encounter Diagnoses  Name Primary?  . Chronic right-sided low back pain with right-sided sciatica Yes  . Cigarette nicotine dependence without complication     PLAN Call if any problems.  Precautions discussed.  Continue current medications.   Return to clinic 3 months   I have reviewed the [facility] [place] web site prior to prescribing narcotic medicine for this patient.  Electronically Signed Darreld McleanWayne Usman Millett, MD 6/20/20189:49 AM

## 2017-01-06 NOTE — Patient Instructions (Signed)
Steps to Quit Smoking Smoking tobacco can be bad for your health. It can also affect almost every organ in your body. Smoking puts you and people around you at risk for many serious Asiah Browder-lasting (chronic) diseases. Quitting smoking is hard, but it is one of the best things that you can do for your health. It is never too late to quit. What are the benefits of quitting smoking? When you quit smoking, you lower your risk for getting serious diseases and conditions. They can include:  Lung cancer or lung disease.  Heart disease.  Stroke.  Heart attack.  Not being able to have children (infertility).  Weak bones (osteoporosis) and broken bones (fractures).  If you have coughing, wheezing, and shortness of breath, those symptoms may get better when you quit. You may also get sick less often. If you are pregnant, quitting smoking can help to lower your chances of having a baby of low birth weight. What can I do to help me quit smoking? Talk with your doctor about what can help you quit smoking. Some things you can do (strategies) include:  Quitting smoking totally, instead of slowly cutting back how much you smoke over a period of time.  Going to in-person counseling. You are more likely to quit if you go to many counseling sessions.  Using resources and support systems, such as: ? Online chats with a counselor. ? Phone quitlines. ? Printed self-help materials. ? Support groups or group counseling. ? Text messaging programs. ? Mobile phone apps or applications.  Taking medicines. Some of these medicines may have nicotine in them. If you are pregnant or breastfeeding, do not take any medicines to quit smoking unless your doctor says it is okay. Talk with your doctor about counseling or other things that can help you.  Talk with your doctor about using more than one strategy at the same time, such as taking medicines while you are also going to in-person counseling. This can help make  quitting easier. What things can I do to make it easier to quit? Quitting smoking might feel very hard at first, but there is a lot that you can do to make it easier. Take these steps:  Talk to your family and friends. Ask them to support and encourage you.  Call phone quitlines, reach out to support groups, or work with a counselor.  Ask people who smoke to not smoke around you.  Avoid places that make you want (trigger) to smoke, such as: ? Bars. ? Parties. ? Smoke-break areas at work.  Spend time with people who do not smoke.  Lower the stress in your life. Stress can make you want to smoke. Try these things to help your stress: ? Getting regular exercise. ? Deep-breathing exercises. ? Yoga. ? Meditating. ? Doing a body scan. To do this, close your eyes, focus on one area of your body at a time from head to toe, and notice which parts of your body are tense. Try to relax the muscles in those areas.  Download or buy apps on your mobile phone or tablet that can help you stick to your quit plan. There are many free apps, such as QuitGuide from the CDC (Centers for Disease Control and Prevention). You can find more support from smokefree.gov and other websites.  This information is not intended to replace advice given to you by your health care provider. Make sure you discuss any questions you have with your health care provider. Document Released: 05/02/2009 Document   Revised: 03/03/2016 Document Reviewed: 11/20/2014 Elsevier Interactive Patient Education  2018 Elsevier Inc.  

## 2017-02-14 ENCOUNTER — Telehealth: Payer: Self-pay | Admitting: Orthopaedic Surgery

## 2017-02-16 MED ORDER — HYDROCODONE-ACETAMINOPHEN 7.5-325 MG PO TABS: 1.0000 | ORAL_TABLET | Freq: Four times a day (QID) | ORAL | 0 refills | Status: DC | PRN

## 2017-03-17 ENCOUNTER — Telehealth: Payer: Self-pay | Admitting: Orthopaedic Surgery

## 2017-03-18 MED ORDER — HYDROCODONE-ACETAMINOPHEN 7.5-325 MG PO TABS: 1.0000 | ORAL_TABLET | Freq: Four times a day (QID) | ORAL | 0 refills | Status: DC | PRN

## 2017-04-07 ENCOUNTER — Ambulatory Visit (INDEPENDENT_AMBULATORY_CARE_PROVIDER_SITE_OTHER): Payer: BLUE CROSS/BLUE SHIELD | Admitting: Orthopaedic Surgery

## 2017-04-07 ENCOUNTER — Encounter: Payer: Self-pay | Admitting: Orthopaedic Surgery

## 2017-04-07 VITALS — BP 146/98 | HR 86 | Temp 97.7°F | Ht 71.0 in | Wt 187.0 lb

## 2017-04-07 DIAGNOSIS — F17210 Nicotine dependence, cigarettes, uncomplicated: Secondary | ICD-10-CM

## 2017-04-07 DIAGNOSIS — G8929 Other chronic pain: Secondary | ICD-10-CM | POA: Diagnosis not present

## 2017-04-07 DIAGNOSIS — M5441 Lumbago with sciatica, right side: Secondary | ICD-10-CM | POA: Diagnosis not present

## 2017-04-07 MED ORDER — DICLOFENAC SODIUM 75 MG PO TBEC: 75.0000 mg | DELAYED_RELEASE_TABLET | Freq: Two times a day (BID) | ORAL | 2 refills | Status: DC

## 2017-04-07 MED ORDER — HYDROCODONE-ACETAMINOPHEN 7.5-325 MG PO TABS: 1.0000 | ORAL_TABLET | Freq: Four times a day (QID) | ORAL | 0 refills | Status: DC | PRN

## 2017-04-07 NOTE — Patient Instructions (Signed)
Steps to Quit Smoking Smoking tobacco can be bad for your health. It can also affect almost every organ in your body. Smoking puts you and people around you at risk for many serious Pierrette Scheu-lasting (chronic) diseases. Quitting smoking is hard, but it is one of the best things that you can do for your health. It is never too late to quit. What are the benefits of quitting smoking? When you quit smoking, you lower your risk for getting serious diseases and conditions. They can include:  Lung cancer or lung disease.  Heart disease.  Stroke.  Heart attack.  Not being able to have children (infertility).  Weak bones (osteoporosis) and broken bones (fractures).  If you have coughing, wheezing, and shortness of breath, those symptoms may get better when you quit. You may also get sick less often. If you are pregnant, quitting smoking can help to lower your chances of having a baby of low birth weight. What can I do to help me quit smoking? Talk with your doctor about what can help you quit smoking. Some things you can do (strategies) include:  Quitting smoking totally, instead of slowly cutting back how much you smoke over a period of time.  Going to in-person counseling. You are more likely to quit if you go to many counseling sessions.  Using resources and support systems, such as: ? Online chats with a counselor. ? Phone quitlines. ? Printed self-help materials. ? Support groups or group counseling. ? Text messaging programs. ? Mobile phone apps or applications.  Taking medicines. Some of these medicines may have nicotine in them. If you are pregnant or breastfeeding, do not take any medicines to quit smoking unless your doctor says it is okay. Talk with your doctor about counseling or other things that can help you.  Talk with your doctor about using more than one strategy at the same time, such as taking medicines while you are also going to in-person counseling. This can help make  quitting easier. What things can I do to make it easier to quit? Quitting smoking might feel very hard at first, but there is a lot that you can do to make it easier. Take these steps:  Talk to your family and friends. Ask them to support and encourage you.  Call phone quitlines, reach out to support groups, or work with a counselor.  Ask people who smoke to not smoke around you.  Avoid places that make you want (trigger) to smoke, such as: ? Bars. ? Parties. ? Smoke-break areas at work.  Spend time with people who do not smoke.  Lower the stress in your life. Stress can make you want to smoke. Try these things to help your stress: ? Getting regular exercise. ? Deep-breathing exercises. ? Yoga. ? Meditating. ? Doing a body scan. To do this, close your eyes, focus on one area of your body at a time from head to toe, and notice which parts of your body are tense. Try to relax the muscles in those areas.  Download or buy apps on your mobile phone or tablet that can help you stick to your quit plan. There are many free apps, such as QuitGuide from the CDC (Centers for Disease Control and Prevention). You can find more support from smokefree.gov and other websites.  This information is not intended to replace advice given to you by your health care provider. Make sure you discuss any questions you have with your health care provider. Document Released: 05/02/2009 Document   Revised: 03/03/2016 Document Reviewed: 11/20/2014 Elsevier Interactive Patient Education  2018 Elsevier Inc.  

## 2017-04-07 NOTE — Progress Notes (Signed)
Patient ZO:XWRUEAV FAARIS Cameron [name], male DOB:[birth year], 51 y.o. WUJ:[id number]  Chief Complaint  Patient presents with  . Follow-up    low back pain    HPI  Cameron [name] is a 51 y.o. male who has chronic lower back pain.  He is stable.  He has some right sided sciatica at times, it comes and goes.  He has no weakness, no bowel or bladder problems.  He is active and working. HPI  Body mass index is 26.08 kg/m.  ROS  Review of Systems  HENT: Negative for congestion.   Respiratory: Negative for cough and shortness of breath.   Cardiovascular: Negative for chest pain and leg swelling.  Endocrine: Negative for cold intolerance.  Musculoskeletal: Positive for arthralgias and back pain.  Allergic/Immunologic: Negative for environmental allergies.    Past Medical History:  Diagnosis Date  . Cancer [facility])     History reviewed. No pertinent surgical history.  History reviewed. No pertinent family history.  Social History Social History  Substance Use Topics  . Smoking status: Current Every Day Smoker  . Smokeless tobacco: Never Used  . Alcohol use Yes    No Known Allergies  Current Outpatient Prescriptions  Medication Sig Dispense Refill  . diclofenac (VOLTAREN) 75 MG EC tablet Take 1 tablet (75 mg total) by mouth 2 (two) times daily with a meal. 60 tablet 2  . HYDROcodone-acetaminophen (NORCO) 7.5-325 MG tablet Take 1 tablet by mouth every 6 (six) hours as needed for moderate pain (Must last 30 days.Do not drive a call or operate machinery while on this medicine.). 80 tablet 0  . lisinopril (PRINIVIL,ZESTRIL) 30 MG tablet Take 30 mg by mouth daily.     No current facility-administered medications for this visit.      Physical Exam  Blood pressure (!) 146/98, pulse 86, temperature 97.7 F (36.5 C), height  (1.803 m), weight 187 lb (84.8 kg).  Constitutional: overall normal hygiene, normal nutrition, well developed, normal grooming, normal body habitus. Assistive  device:none  Musculoskeletal: gait and station Limp none, muscle tone and strength are normal, no tremors or atrophy is present.  .  Neurological: coordination overall normal.  Deep tendon reflex/nerve stretch intact.  Sensation normal.  Cranial nerves II-XII intact.   Skin:   Normal overall no scars, lesions, ulcers or rashes. No psoriasis.  Psychiatric: Alert and oriented x 3.  Recent memory intact, remote memory unclear.  Normal mood and affect. Well groomed.  Good eye contact.  Cardiovascular: overall no swelling, no varicosities, no edema bilaterally, normal temperatures of the legs and arms, no clubbing, cyanosis and good capillary refill.  Lymphatic: palpation is normal.  All other systems reviewed and are negative   Spine/Pelvis examination:  Inspection:  Overall, sacoiliac joint benign and hips nontender; without crepitus or defects.   Thoracic spine inspection: Alignment normal without kyphosis present   Lumbar spine inspection:  Alignment  with normal lumbar lordosis, without scoliosis apparent.   Thoracic spine palpation:  without tenderness of spinal processes   Lumbar spine palpation: with tenderness of lumbar area; without tightness of lumbar muscles    Range of Motion:   Lumbar flexion, forward flexion is 45 without pain or tenderness    Lumbar extension is 15 without pain or tenderness   Left lateral bend is Normal  without pain or tenderness   Right lateral bend is Normal without pain or tenderness   Straight leg raising is Normal   Strength & tone: Normal   Stability overall normal  stability    The patient has been educated about the nature of the problem(s) and counseled on treatment options.  The patient appeared to understand what I have discussed and is in agreement with it.  Encounter Diagnoses  Name Primary?  . Chronic right-sided low back pain with right-sided sciatica Yes  . Cigarette nicotine dependence without complication     PLAN Call if  any problems.  Precautions discussed.  Continue current medications.   Return to clinic 3 months   I have reviewed the [facility] Controlled Substance Reporting System web site prior to prescribing narcotic medicine for this patient.  Electronically Signed Darreld Mclean, MD 9/19/20189:22 AM

## 2017-05-13 ENCOUNTER — Telehealth: Payer: Self-pay | Admitting: Orthopaedic Surgery

## 2017-05-17 MED ORDER — HYDROCODONE-ACETAMINOPHEN 7.5-325 MG PO TABS: 1.0000 | ORAL_TABLET | Freq: Four times a day (QID) | ORAL | 0 refills | Status: DC | PRN

## 2017-06-14 ENCOUNTER — Other Ambulatory Visit: Payer: Self-pay | Admitting: Orthopaedic Surgery

## 2017-06-15 MED ORDER — HYDROCODONE-ACETAMINOPHEN 7.5-325 MG PO TABS: 1.0000 | ORAL_TABLET | Freq: Four times a day (QID) | ORAL | 0 refills | Status: DC | PRN

## 2017-07-07 ENCOUNTER — Encounter: Payer: Self-pay | Admitting: Orthopaedic Surgery

## 2017-07-07 ENCOUNTER — Ambulatory Visit: Payer: Self-pay | Admitting: Orthopaedic Surgery

## 2017-07-07 VITALS — BP 153/104 | HR 99 | Temp 97.5°F | Ht 71.0 in | Wt 192.0 lb

## 2017-07-07 DIAGNOSIS — G8929 Other chronic pain: Secondary | ICD-10-CM

## 2017-07-07 DIAGNOSIS — F17210 Nicotine dependence, cigarettes, uncomplicated: Secondary | ICD-10-CM

## 2017-07-07 DIAGNOSIS — M5441 Lumbago with sciatica, right side: Secondary | ICD-10-CM

## 2017-07-07 MED ORDER — HYDROCODONE-ACETAMINOPHEN 7.5-325 MG PO TABS: 1.0000 | ORAL_TABLET | Freq: Four times a day (QID) | ORAL | 0 refills | Status: DC | PRN

## 2017-07-07 NOTE — Patient Instructions (Addendum)
Steps to Quit Smoking Smoking tobacco can be bad for your health. It can also affect almost every organ in your body. Smoking puts you and people around you at risk for many serious Cebastian Neis-lasting (chronic) diseases. Quitting smoking is hard, but it is one of the best things that you can do for your health. It is never too late to quit. What are the benefits of quitting smoking? When you quit smoking, you lower your risk for getting serious diseases and conditions. They can include:  Lung cancer or lung disease.  Heart disease.  Stroke.  Heart attack.  Not being able to have children (infertility).  Weak bones (osteoporosis) and broken bones (fractures).  If you have coughing, wheezing, and shortness of breath, those symptoms may get better when you quit. You may also get sick less often. If you are pregnant, quitting smoking can help to lower your chances of having a baby of low birth weight. What can I do to help me quit smoking? Talk with your doctor about what can help you quit smoking. Some things you can do (strategies) include:  Quitting smoking totally, instead of slowly cutting back how much you smoke over a period of time.  Going to in-person counseling. You are more likely to quit if you go to many counseling sessions.  Using resources and support systems, such as: ? Online chats with a counselor. ? Phone quitlines. ? Printed self-help materials. ? Support groups or group counseling. ? Text messaging programs. ? Mobile phone apps or applications.  Taking medicines. Some of these medicines may have nicotine in them. If you are pregnant or breastfeeding, do not take any medicines to quit smoking unless your doctor says it is okay. Talk with your doctor about counseling or other things that can help you.  Talk with your doctor about using more than one strategy at the same time, such as taking medicines while you are also going to in-person counseling. This can help make  quitting easier. What things can I do to make it easier to quit? Quitting smoking might feel very hard at first, but there is a lot that you can do to make it easier. Take these steps:  Talk to your family and friends. Ask them to support and encourage you.  Call phone quitlines, reach out to support groups, or work with a counselor.  Ask people who smoke to not smoke around you.  Avoid places that make you want (trigger) to smoke, such as: ? Bars. ? Parties. ? Smoke-break areas at work.  Spend time with people who do not smoke.  Lower the stress in your life. Stress can make you want to smoke. Try these things to help your stress: ? Getting regular exercise. ? Deep-breathing exercises. ? Yoga. ? Meditating. ? Doing a body scan. To do this, close your eyes, focus on one area of your body at a time from head to toe, and notice which parts of your body are tense. Try to relax the muscles in those areas.  Download or buy apps on your mobile phone or tablet that can help you stick to your quit plan. There are many free apps, such as QuitGuide from the CDC (Centers for Disease Control and Prevention). You can find more support from smokefree.gov and other websites.  This information is not intended to replace advice given to you by your health care provider. Make sure you discuss any questions you have with your health care provider. Document Released: 05/02/2009 Document   Revised: 03/03/2016 Document Reviewed: 11/20/2014 Elsevier Interactive Patient Education  2018 Elsevier Inc.  

## 2017-07-07 NOTE — Progress Notes (Signed)
Patient ON:GEXBMWU:Cameron [name], male DOB:01-Jan-1966, 51 y.o. XLK:[id number]  Chief Complaint  Patient presents with  . Follow-up    Low back pain    HPI  Cameron [name] is a 51 y.o. male who has chronic pain of the lower back. He has more pain now with the cold weather. He is working regularly.  He has no paresthesias today but has had it on the right the other week. He is taking his medicine.    He smokes and has been doing so for over 30 years.  He has cut back to about a half a pack a day now. HPI  Body mass index is 26.78 kg/m.  ROS  Review of Systems  HENT: Negative for congestion.   Respiratory: Negative for cough and shortness of breath.   Cardiovascular: Negative for chest pain and leg swelling.  Endocrine: Negative for cold intolerance.  Musculoskeletal: Positive for arthralgias and back pain.  Allergic/Immunologic: Negative for environmental allergies.  All other systems reviewed and are negative.   Past Medical History:  Diagnosis Date  . Cancer [facility](H[facility])     History reviewed. No pertinent surgical history.  History reviewed. No pertinent family history.  Social History Social History   Tobacco Use  . Smoking status: Current Every Day Smoker  . Smokeless tobacco: Never Used  Substance Use Topics  . Alcohol use: Yes  . Drug use: No    No Known Allergies  Current Outpatient Medications  Medication Sig Dispense Refill  . diclofenac (VOLTAREN) 75 MG EC tablet Take 1 tablet (75 mg total) by mouth 2 (two) times daily with a meal. 60 tablet 2  . HYDROcodone-acetaminophen (NORCO) 7.5-325 MG tablet Take 1 tablet by mouth every 6 (six) hours as needed for moderate pain (Must last 30 days.Do not drive a call or operate machinery while on this medicine.). 75 tablet 0  . lisinopril (PRINIVIL,ZESTRIL) 30 MG tablet Take 30 mg by mouth daily.     No current facility-administered medications for this visit.      Physical Exam  Blood pressure (!) 153/104, pulse 99,  temperature (!) 97.5 F (36.4 C), height 5\' 11"  (1.803 m), weight 192 lb (87.1 kg).  Constitutional: overall normal hygiene, normal nutrition, well developed, normal grooming, normal body habitus. Assistive device:none  Musculoskeletal: gait and station Limp none, muscle tone and strength are normal, no tremors or atrophy is present.  .  Neurological: coordination overall normal.  Deep tendon reflex/nerve stretch intact.  Sensation normal.  Cranial nerves II-XII intact.   Skin:   Normal overall no scars, lesions, ulcers or rashes. No psoriasis.  Psychiatric: Alert and oriented x 3.  Recent memory intact, remote memory unclear.  Normal mood and affect. Well groomed.  Good eye contact.  Cardiovascular: overall no swelling, no varicosities, no edema bilaterally, normal temperatures of the legs and arms, no clubbing, cyanosis and good capillary refill.  Lymphatic: palpation is normal.  All other systems reviewed and are negative   Spine/Pelvis examination:  Inspection:  Overall, sacoiliac joint benign and hips nontender; without crepitus or defects.   Thoracic spine inspection: Alignment normal without kyphosis present   Lumbar spine inspection:  Alignment  with normal lumbar lordosis, without scoliosis apparent.   Thoracic spine palpation:  without tenderness of spinal processes   Lumbar spine palpation: without tenderness of lumbar area; without tightness of lumbar muscles    Range of Motion:   Lumbar flexion, forward flexion is normal without pain or tenderness    Lumbar  extension is full without pain or tenderness   Left lateral bend is normal without pain or tenderness   Right lateral bend is normal without pain or tenderness   Straight leg raising is normal  Strength & tone: normal   Stability overall normal stability The patient has been educated about the nature of the problem(s) and counseled on treatment options.  The patient appeared to understand what I have discussed  and is in agreement with it.  Encounter Diagnoses  Name Primary?  . Chronic right-sided low back pain with right-sided sciatica Yes  . Cigarette nicotine dependence without complication     PLAN Call if any problems.  Precautions discussed.  Continue current medications.   Return to clinic 2 months   I have reviewed the [facility] Cont[facility] Sub[facility] Reporting System web site prior to prescribing narcotic medicine for this patient.  The patient was counseled about smoking and smoking cessation.  I spent about three to five minutes in doing this.  I, of course, determined the patient does smoke and frequency.  I have advised the patient of problems medically that can occur with continued smoking including poor wound and bone healing as well as the obvious respiratory problems.  I have assessed the patient's willingness to cut back and quit smoking.  The patient has stated he is willing to try to cut back more.  I have told the patient to discuss with their family doctor also about smoking cessation.  I am willing to assist my patient in arranging this and to get additional help.I have suggested the patient have a set date to try to begin cutting back.  I have printed out a smoking cessation form about how to begin cutting back and suggestions.  I will discuss this further when I see the patient in the future.  I have stressed that although this will be very difficult to accomplish, the benefits are very substantial and will give long term health benefits from now on.  Electronically Signed Darreld McleanWayne Kieffer Blatz, MD 12/19/20189:44 AM

## 2017-08-10 ENCOUNTER — Other Ambulatory Visit: Payer: Self-pay | Admitting: Orthopaedic Surgery

## 2017-08-10 MED ORDER — HYDROCODONE-ACETAMINOPHEN 7.5-325 MG PO TABS: 1.0000 | ORAL_TABLET | Freq: Four times a day (QID) | ORAL | 0 refills | Status: DC | PRN

## 2017-09-07 ENCOUNTER — Encounter: Payer: Self-pay | Admitting: Orthopaedic Surgery

## 2017-09-07 ENCOUNTER — Ambulatory Visit: Payer: Self-pay | Admitting: Orthopaedic Surgery

## 2017-09-07 VITALS — BP 155/95 | HR 90 | Temp 98.0°F | Ht 71.0 in | Wt 189.0 lb

## 2017-09-07 DIAGNOSIS — M5441 Lumbago with sciatica, right side: Secondary | ICD-10-CM

## 2017-09-07 DIAGNOSIS — F17210 Nicotine dependence, cigarettes, uncomplicated: Secondary | ICD-10-CM

## 2017-09-07 DIAGNOSIS — G8929 Other chronic pain: Secondary | ICD-10-CM

## 2017-09-07 MED ORDER — IBUPROFEN 600 MG PO TABS: ORAL_TABLET | ORAL | 5 refills | Status: DC

## 2017-09-07 MED ORDER — HYDROCODONE-ACETAMINOPHEN 7.5-325 MG PO TABS: 1.0000 | ORAL_TABLET | Freq: Four times a day (QID) | ORAL | 0 refills | Status: DC | PRN

## 2017-09-07 NOTE — Patient Instructions (Signed)
Steps to Quit Smoking Smoking tobacco can be bad for your health. It can also affect almost every organ in your body. Smoking puts you and people around you at risk for many serious long-lasting (chronic) diseases. Quitting smoking is hard, but it is one of the best things that you can do for your health. It is never too late to quit. What are the benefits of quitting smoking? When you quit smoking, you lower your risk for getting serious diseases and conditions. They can include:  Lung cancer or lung disease.  Heart disease.  Stroke.  Heart attack.  Not being able to have children (infertility).  Weak bones (osteoporosis) and broken bones (fractures).  If you have coughing, wheezing, and shortness of breath, those symptoms may get better when you quit. You may also get sick less often. If you are pregnant, quitting smoking can help to lower your chances of having a baby of low birth weight. What can I do to help me quit smoking? Talk with your doctor about what can help you quit smoking. Some things you can do (strategies) include:  Quitting smoking totally, instead of slowly cutting back how much you smoke over a period of time.  Going to in-person counseling. You are more likely to quit if you go to many counseling sessions.  Using resources and support systems, such as: ? Online chats with a counselor. ? Phone quitlines. ? Printed self-help materials. ? Support groups or group counseling. ? Text messaging programs. ? Mobile phone apps or applications.  Taking medicines. Some of these medicines may have nicotine in them. If you are pregnant or breastfeeding, do not take any medicines to quit smoking unless your doctor says it is okay. Talk with your doctor about counseling or other things that can help you.  Talk with your doctor about using more than one strategy at the same time, such as taking medicines while you are also going to in-person counseling. This can help make  quitting easier. What things can I do to make it easier to quit? Quitting smoking might feel very hard at first, but there is a lot that you can do to make it easier. Take these steps:  Talk to your family and friends. Ask them to support and encourage you.  Call phone quitlines, reach out to support groups, or work with a counselor.  Ask people who smoke to not smoke around you.  Avoid places that make you want (trigger) to smoke, such as: ? Bars. ? Parties. ? Smoke-break areas at work.  Spend time with people who do not smoke.  Lower the stress in your life. Stress can make you want to smoke. Try these things to help your stress: ? Getting regular exercise. ? Deep-breathing exercises. ? Yoga. ? Meditating. ? Doing a body scan. To do this, close your eyes, focus on one area of your body at a time from head to toe, and notice which parts of your body are tense. Try to relax the muscles in those areas.  Download or buy apps on your mobile phone or tablet that can help you stick to your quit plan. There are many free apps, such as QuitGuide from the CDC (Centers for Disease Control and Prevention). You can find more support from smokefree.gov and other websites.  This information is not intended to replace advice given to you by your health care provider. Make sure you discuss any questions you have with your health care provider. Document Released: 05/02/2009 Document   Revised: 03/03/2016 Document Reviewed: 11/20/2014 Elsevier Interactive Patient Education  2018 Elsevier Inc.  

## 2017-09-07 NOTE — Progress Notes (Signed)
Patient Cameron [name], male DOB:1965/08/12, 51 y.o. WUJ:[id number]  Chief Complaint  Patient presents with  . Back Pain    HPI  Cameron [name] is a 52 y.o. male who has chronic lower back pain. He has had more pain with the cold weather.  He has no paresthesias.  He is active and doing his exercises.  He has no weakness.  He is taking his medicine. I will switch from diclofenac to ibuprofen. HPI  Body mass index is 26.36 kg/m.  ROS  Review of Systems  HENT: Negative for congestion.   Respiratory: Negative for cough and shortness of breath.   Cardiovascular: Negative for chest pain and leg swelling.  Endocrine: Negative for cold intolerance.  Musculoskeletal: Positive for arthralgias and back pain.  Allergic/Immunologic: Negative for environmental allergies.  All other systems reviewed and are negative.   Past Medical History:  Diagnosis Date  . Cancer [facility])     History reviewed. No pertinent surgical history.  History reviewed. No pertinent family history.  Social History Social History   Tobacco Use  . Smoking status: Current Every Day Smoker  . Smokeless tobacco: Never Used  Substance Use Topics  . Alcohol use: Yes  . Drug use: No    No Known Allergies  Current Outpatient Medications  Medication Sig Dispense Refill  . HYDROcodone-acetaminophen (NORCO) 7.5-325 MG tablet Take 1 tablet by mouth every 6 (six) hours as needed for moderate pain (Must last 30 days). 75 tablet 0  . lisinopril (PRINIVIL,ZESTRIL) 30 MG tablet Take 30 mg by mouth daily.    Marland Kitchen ibuprofen (ADVIL,MOTRIN) 600 MG tablet One tablet twice a day for lower back pain 100 tablet 5   No current facility-administered medications for this visit.      Physical Exam  Blood pressure (!) 155/95, pulse 90, temperature 98 F (36.7 C), height 5\' 11"  (1.803 m), weight 189 lb (85.7 kg).  Constitutional: overall normal hygiene, normal nutrition, well developed, normal grooming, normal body  habitus. Assistive device:none  Musculoskeletal: gait and station Limp none, muscle tone and strength are normal, no tremors or atrophy is present.  .  Neurological: coordination overall normal.  Deep tendon reflex/nerve stretch intact.  Sensation normal.  Cranial nerves II-XII intact.   Skin:   Normal overall no scars, lesions, ulcers or rashes. No psoriasis.  Psychiatric: Alert and oriented x 3.  Recent memory intact, remote memory unclear.  Normal mood and affect. Well groomed.  Good eye contact.  Cardiovascular: overall no swelling, no varicosities, no edema bilaterally, normal temperatures of the legs and arms, no clubbing, cyanosis and good capillary refill.  Lymphatic: palpation is normal.  Spine/Pelvis examination:  Inspection:  Overall, sacoiliac joint benign and hips nontender; without crepitus or defects.   Thoracic spine inspection: Alignment normal without kyphosis present   Lumbar spine inspection:  Alignment  with normal lumbar lordosis, without scoliosis apparent.   Thoracic spine palpation:  without tenderness of spinal processes   Lumbar spine palpation: without tenderness of lumbar area; without tightness of lumbar muscles    Range of Motion:   Lumbar flexion, forward flexion is normal without pain or tenderness    Lumbar extension is full without pain or tenderness   Left lateral bend is normal without pain or tenderness   Right lateral bend is normal without pain or tenderness   Straight leg raising is normal  Strength & tone: normal   Stability overall normal stability All other systems reviewed and are negative   The  patient has been educated about the nature of the problem(s) and counseled on treatment options.  The patient appeared to understand what I have discussed and is in agreement with it.  Encounter Diagnoses  Name Primary?  . Chronic right-sided low back pain with right-sided sciatica Yes  . Cigarette nicotine dependence without complication      PLAN Call if any problems.  Precautions discussed.  Continue current medications.   Return to clinic 3 months   I have reviewed the [facility] Controlled Substance Reporting System web site prior to prescribing narcotic medicine for this patient.  Electronically Signed Darreld McleanWayne Cincere Deprey, MD 2/19/20199:30 AM

## 2017-10-06 ENCOUNTER — Other Ambulatory Visit: Payer: Self-pay | Admitting: Orthopedic Surgery

## 2017-10-06 ENCOUNTER — Other Ambulatory Visit: Payer: Self-pay | Admitting: Orthopaedic Surgery

## 2017-10-06 MED ORDER — HYDROCODONE-ACETAMINOPHEN 7.5-325 MG PO TABS: 1.0000 | ORAL_TABLET | Freq: Four times a day (QID) | ORAL | 0 refills | Status: DC | PRN

## 2017-11-03 ENCOUNTER — Other Ambulatory Visit: Payer: Self-pay | Admitting: Orthopedic Surgery

## 2017-11-03 MED ORDER — HYDROCODONE-ACETAMINOPHEN 7.5-325 MG PO TABS: 1.0000 | ORAL_TABLET | Freq: Four times a day (QID) | ORAL | 0 refills | Status: DC | PRN

## 2017-11-16 ENCOUNTER — Encounter (HOSPITAL_COMMUNITY): Payer: Self-pay | Admitting: Emergency Medicine

## 2017-11-16 ENCOUNTER — Emergency Department (HOSPITAL_COMMUNITY)
Admission: EM | Admit: 2017-11-16 | Discharge: 2017-11-16 | Disposition: A | Discharge status: Home / Self Care | Payer: Self-pay | Attending: Emergency Medicine | Admitting: Emergency Medicine

## 2017-11-16 ENCOUNTER — Other Ambulatory Visit: Payer: Self-pay

## 2017-11-16 ENCOUNTER — Emergency Department (HOSPITAL_COMMUNITY): Payer: Self-pay

## 2017-11-16 DIAGNOSIS — I10 Essential (primary) hypertension: Secondary | ICD-10-CM

## 2017-11-16 DIAGNOSIS — Z79899 Other long term (current) drug therapy: Secondary | ICD-10-CM | POA: Insufficient documentation

## 2017-11-16 DIAGNOSIS — F17210 Nicotine dependence, cigarettes, uncomplicated: Secondary | ICD-10-CM | POA: Insufficient documentation

## 2017-11-16 DIAGNOSIS — M7532 Calcific tendinitis of left shoulder: Secondary | ICD-10-CM

## 2017-11-16 HISTORY — DX: Other chronic pain: G89.29

## 2017-11-16 HISTORY — DX: Pain in unspecified shoulder: M25.519

## 2017-11-16 HISTORY — DX: Essential (primary) hypertension: I10

## 2017-11-16 LAB — I-STAT CHEM 8, ED
BUN: 16 mg/dL (ref 6–20)
CALCIUM ION: 1.03 mmol/L — AB (ref 1.15–1.40)
CHLORIDE: 103 mmol/L (ref 101–111)
Creatinine, Ser: 0.7 mg/dL (ref 0.61–1.24)
Glucose, Bld: 217 mg/dL — ABNORMAL HIGH (ref 65–99)
HCT: 50 % (ref 39.0–52.0)
Hemoglobin: 17 g/dL (ref 13.0–17.0)
Potassium: 5.3 mmol/L — ABNORMAL HIGH (ref 3.5–5.1)
Sodium: 134 mmol/L — ABNORMAL LOW (ref 135–145)
TCO2: 25 mmol/L (ref 22–32)

## 2017-11-16 MED ORDER — LISINOPRIL 10 MG PO TABS
20.0000 mg | ORAL_TABLET | Freq: Once | ORAL | Status: AC
  Administered 2017-11-16: 20 mg via ORAL
  Filled 2017-11-16: qty 2

## 2017-11-16 MED ORDER — HYDROCHLOROTHIAZIDE 25 MG PO TABS: 25.0000 mg | ORAL_TABLET | Freq: Every day | ORAL | 0 refills | Status: DC

## 2017-11-16 MED ORDER — NAPROXEN 500 MG PO TABS: 500.0000 mg | ORAL_TABLET | Freq: Two times a day (BID) | ORAL | 0 refills | Status: DC

## 2017-11-16 NOTE — ED Provider Notes (Signed)
[facility] EMERGENCY DEPARTMENT Provider Note   CSN: [id number] Arrival date & time: 11/16/17  1515     History   Chief Complaint Chief Complaint  Patient presents with  . Shoulder Pain    HPI Cameron [name] is a 52 y.o. male.  HPI   Cameron [name] is a 52 y.o. male who presents to the Emergency Department complaining of worsening of recurrent left shoulder pain.  He reports having recurrent shoulder pain "for years" after a rotator cuff injury.  He states the pain has been worse for 5 days after picking up a heavy box at work.  Pain to the shoulder is worse with movement and improves with rest and from taking ibuprofen.  He denies chest pain, shortness fo breath, numbness or weakness of left arm and neck pain, dizziness or headache. He states that he routinely sees Dr. Hilda Lias for his shoulder and chronic low back pain and has been prescribed hydrocodone which he states he has only taken "one or two tabs" because he doesn't help and makes him feel "bad"   Past Medical History:  Diagnosis Date  . Chronic shoulder pain   . Hypertension     There are no active problems to display for this patient.   History reviewed. No pertinent surgical history.    Home Medications    Prior to Admission medications   Medication Sig Start Date End Date Taking? Authorizing Provider  HYDROcodone-acetaminophen (NORCO) 7.5-325 MG tablet Take 1 tablet by mouth every 6 (six) hours as needed for moderate pain (Must last 30 days). 11/03/17   Vickki Hearing, MD  ibuprofen (ADVIL,MOTRIN) 600 MG tablet One tablet twice a day for lower back pain 09/07/17   Darreld Mclean, MD  lisinopril (PRINIVIL,ZESTRIL) 30 MG tablet Take 30 mg by mouth daily.    [provider]    Family History No family history on file.  Social History Social History   Tobacco Use  . Smoking status: Current Every Day Smoker    Packs/day: 0.50    Types: Cigarettes  . Smokeless tobacco: Never Used  Substance  Use Topics  . Alcohol use: Yes    Comment: socially   . Drug use: No     Allergies   Patient has no known allergies.   Review of Systems Review of Systems  Constitutional: Negative for chills, diaphoresis and fever.  Respiratory: Negative for chest tightness and shortness of breath.   Cardiovascular: Negative for chest pain.  Gastrointestinal: Negative for nausea and vomiting.  Musculoskeletal: Positive for arthralgias (left shoulder pain). Negative for joint swelling, neck pain and neck stiffness.  Skin: Negative for color change, rash and wound.  Neurological: Negative for dizziness, weakness, light-headedness, numbness and headaches.  All other systems reviewed and are negative.    Physical Exam Updated Vital Signs BP (!) 182/105 (BP Location: Right Arm)   Pulse (!) 101   Temp 98.4 F (36.9 C) (Oral)   Resp 16   Ht  (1.778 m)   Wt 83.9 kg (185 lb)   SpO2 100%   BMI 26.54 kg/m   Physical Exam  Constitutional: He is oriented to person, place, and time. He appears well-developed and well-nourished. No distress.  HENT:  Head: Normocephalic and atraumatic.  Neck: Normal range of motion and full passive range of motion without pain. Neck supple. No thyromegaly present.  Cardiovascular: Normal rate, regular rhythm and intact distal pulses.  No murmur heard. Pulmonary/Chest: Effort normal and breath sounds normal.  No respiratory distress. He exhibits no tenderness.  Musculoskeletal: He exhibits tenderness. He exhibits no edema or deformity.       Left shoulder: He exhibits tenderness. He exhibits normal range of motion, no swelling, no effusion, no crepitus, normal pulse and normal strength.  Focal ttp of the anterior left shoulder and reproduced with abduction although pt has FROM.  No rash, edema or erythema of the shoulder.  No tenderness of the cervical spine.  No motor weakness on exam.    Lymphadenopathy:    He has no cervical adenopathy.  Neurological: He is  alert and oriented to person, place, and time. He has normal strength. No sensory deficit. He exhibits normal muscle tone. Coordination normal. GCS eye subscore is 4. GCS verbal subscore is 5. GCS motor subscore is 6.  Reflex Scores:      Tricep reflexes are 1+ on the right side and 2+ on the left side.      Bicep reflexes are 1+ on the right side and 2+ on the left side. Skin: Skin is warm and dry. Capillary refill takes less than 2 seconds.  Psychiatric: He has a normal mood and affect.  Nursing note and vitals reviewed.     ED Treatments / Results  Labs (all labs ordered are listed, but only abnormal results are displayed) Labs Reviewed  I-STAT CHEM 8, ED - Abnormal; Notable for the following components:      Result Value   Sodium 134 (*)    Potassium 5.3 (*)    Glucose, Bld 217 (*)    Calcium, Ion 1.03 (*)    All other components within normal limits    EKG None  Radiology Dg Shoulder Left  Result Date: 11/16/2017 CLINICAL DATA:  Chronic shoulder pain worsening for 5 days. Remote history of injury. EXAM: LEFT SHOULDER - 2+ VIEW COMPARISON:  None. FINDINGS: The humeral head is well-formed and located. Tiny corticated calcifications projecting humeral head on axillary view associated with calcific tendinopathy. The subacromial, glenohumeral and acromioclavicular joint spaces are intact. No destructive bony lesions. Soft tissue planes are non-suspicious. IMPRESSION: *Possible calcific tendinopathy. *No acute osseous process. Electronically Signed   By: Awilda Metro M.D.   On: 11/16/2017 16:24    Procedures Procedures (including critical care time)  Medications Ordered in ED Medications - No data to display   Initial Impression / Assessment and Plan / ED Course  I have reviewed the triage vital signs and the nursing notes.  Pertinent labs & imaging results that were available during my care of the patient were reviewed by me and considered in my medical decision making  (see chart for details).     Controlled Substance Prescriptions [place] Controlled Substance Registry consulted? Yes, I have consulted the [place] Controlled Substances Registry for this patient.  Pt received #75 hydrocodone/acetam 7.5/325 mg filled on 11/10/17  When questioned about his medications, pt states that he only took one or two of the hydrocodone because " it doesn't help and makes me feel bad" but on review on the database, pt is receiving monthly prescriptions.    1630  Consulted Dr. Romeo Apple and informed him of patient's statement regarding the hydrocodone. I am concerned about diversion.  Pt aware that he will not be receiving any narcotic pain medication.  I will check kidney function prior to prescribing NSAID since pt does not have any recent labs in our system.    Pt is hypertensive with hx of same.  States that he takes Lisinopril,  but has not taken it today.  Last rx in our system was dated 2015.  I am doubtful that he takes lisinopril routinely, I will prescribe HCTZ and naprosyn pending nml renal function.  Shoulder pain is felt to be inflammatory and I doubt cardiac process or septic joint.  Pt has upcoming appt with Dr. Hilda Lias and agrees to plan.    Final Clinical Impressions(s) / ED Diagnoses   Final diagnoses:  Calcific tendinitis of left shoulder  Essential hypertension    ED Discharge Orders    None       Rosey Bath 11/16/17 2130    Rolland Porter, MD 11/20/17 [phone]

## 2017-11-16 NOTE — ED Provider Notes (Signed)
Pt was signed out to me awaiting chem 8 to ensure adequate renal function prior to prescribing naproxen.  Renal function is fine.  Potassium slightly elevated at 5.3 but the sample was also some hemolyzed.  Pt dc'd home with naproxen and hctz for bp tx.  F/u with Dr Elzie Rings, PA-C 11/16/17 1801    Rolland Porter, MD 11/20/17 [phone]

## 2017-11-16 NOTE — ED Triage Notes (Signed)
Pt c/o chronic shoulder pain that has worsened since Friday. Denies injury/fall. Pt has appointment with Dr. Hilda Lias 5/15.

## 2017-11-16 NOTE — Discharge Instructions (Addendum)
Your blood pressure today is elevated.  It's important that you take your blood pressure medication daily and I have added another medication for your to start.  Keep your appt with Dr. Hilda Lias for 12/01/17 regarding your shoulder pain.  Return to ER for any worsening symptoms such as chest pain, shortness of breath, numbness or weakness of the extremity.

## 2017-11-29 ENCOUNTER — Other Ambulatory Visit: Payer: Self-pay | Admitting: Orthopedic Surgery

## 2017-11-30 MED ORDER — HYDROCODONE-ACETAMINOPHEN 7.5-325 MG PO TABS: 1.0000 | ORAL_TABLET | Freq: Four times a day (QID) | ORAL | 0 refills | Status: DC | PRN

## 2017-12-01 ENCOUNTER — Ambulatory Visit: Payer: Self-pay | Admitting: Orthopaedic Surgery

## 2017-12-14 ENCOUNTER — Ambulatory Visit: Payer: Self-pay | Admitting: Orthopaedic Surgery

## 2018-08-03 ENCOUNTER — Other Ambulatory Visit: Payer: Self-pay

## 2018-08-03 ENCOUNTER — Emergency Department (HOSPITAL_COMMUNITY)
Admission: EM | Admit: 2018-08-03 | Discharge: 2018-08-03 | Disposition: A | Discharge status: Home / Self Care | Payer: Self-pay | Attending: Emergency Medicine | Admitting: Emergency Medicine

## 2018-08-03 ENCOUNTER — Encounter (HOSPITAL_COMMUNITY): Payer: Self-pay | Admitting: Emergency Medicine

## 2018-08-03 DIAGNOSIS — K029 Dental caries, unspecified: Secondary | ICD-10-CM | POA: Insufficient documentation

## 2018-08-03 DIAGNOSIS — F17210 Nicotine dependence, cigarettes, uncomplicated: Secondary | ICD-10-CM | POA: Insufficient documentation

## 2018-08-03 DIAGNOSIS — I10 Essential (primary) hypertension: Secondary | ICD-10-CM | POA: Insufficient documentation

## 2018-08-03 DIAGNOSIS — K0889 Other specified disorders of teeth and supporting structures: Secondary | ICD-10-CM

## 2018-08-03 MED ORDER — CLINDAMYCIN HCL 150 MG PO CAPS: 150.0000 mg | ORAL_CAPSULE | Freq: Four times a day (QID) | ORAL | 0 refills | Status: DC

## 2018-08-03 MED ORDER — IBUPROFEN 800 MG PO TABS
800.0000 mg | ORAL_TABLET | Freq: Once | ORAL | Status: AC
  Administered 2018-08-03: 800 mg via ORAL
  Filled 2018-08-03: qty 1

## 2018-08-03 MED ORDER — ACETAMINOPHEN 500 MG PO TABS
1000.0000 mg | ORAL_TABLET | Freq: Once | ORAL | Status: AC
  Administered 2018-08-03: 1000 mg via ORAL
  Filled 2018-08-03: qty 2

## 2018-08-03 MED ORDER — CLINDAMYCIN HCL 150 MG PO CAPS
300.0000 mg | ORAL_CAPSULE | Freq: Once | ORAL | Status: AC
  Administered 2018-08-03: 300 mg via ORAL
  Filled 2018-08-03: qty 2

## 2018-08-03 MED ORDER — ONDANSETRON HCL 4 MG PO TABS
4.0000 mg | ORAL_TABLET | Freq: Once | ORAL | Status: AC
  Administered 2018-08-03: 4 mg via ORAL
  Filled 2018-08-03: qty 1

## 2018-08-03 MED ORDER — TRAMADOL HCL 50 MG PO TABS: 50.0000 mg | ORAL_TABLET | Freq: Four times a day (QID) | ORAL | 0 refills | Status: DC | PRN

## 2018-08-03 MED ORDER — IBUPROFEN 600 MG PO TABS: 600.0000 mg | ORAL_TABLET | Freq: Four times a day (QID) | ORAL | 0 refills | Status: DC

## 2018-08-03 NOTE — Discharge Instructions (Addendum)
Your blood pressure is extremely elevated.  Please make sure you take your blood pressure medications in a timely fashion.  Please have your blood pressure rechecked soon.  Please see your dentist as soon as possible.  Use ibuprofen and clindamycin with breakfast, lunch, dinner, and at bedtime.  Use ultram at bedtime or every 6 hours as needed.

## 2018-08-03 NOTE — ED Triage Notes (Addendum)
Pt c/o of right dental pain with swelling since Monday.  BP elevated. Had BP meds but didn't take meds today

## 2018-08-03 NOTE — ED Notes (Signed)
Pt reports he has not taken his BP meds in 1 day, denies running out of medication. Denies HA, dizziness, or vision changes.

## 2018-08-03 NOTE — ED Provider Notes (Signed)
[facility] EMERGENCY DEPARTMENT Provider Note   CSN: [id number] Arrival date & time: 08/03/18  1258     History   Chief Complaint Chief Complaint  Patient presents with  . Dental Injury    HPI Cameron [name] is a 53 y.o. male.  The history is provided by the patient.  Dental Pain  Location:  Lower Lower teeth location:  31/RL 2nd molar Quality:  Aching and throbbing Severity:  Moderate Onset quality:  Gradual Duration:  3 days Timing:  Intermittent Progression:  Worsening Chronicity:  Recurrent Context: dental caries and dental fracture   Relieved by:  Nothing Ineffective treatments:  Acetaminophen Associated symptoms: no neck pain   Risk factors: lack of dental care     Past Medical History:  Diagnosis Date  . Chronic shoulder pain   . Hypertension     There are no active problems to display for this patient.   History reviewed. No pertinent surgical history.      Home Medications    Prior to Admission medications   Medication Sig Start Date End Date Taking? Authorizing Provider  hydrochlorothiazide (HYDRODIURIL) 25 MG tablet Take 1 tablet (25 mg total) by mouth daily. 11/16/17   Triplett, Tammy, PA-C  HYDROcodone-acetaminophen (NORCO) 7.5-325 MG tablet Take 1 tablet by mouth every 6 (six) hours as needed for moderate pain (Must last 30 days). 11/30/17   Darreld Mclean, MD  ibuprofen (ADVIL,MOTRIN) 600 MG tablet One tablet twice a day for lower back pain 09/07/17   Darreld Mclean, MD  lisinopril (PRINIVIL,ZESTRIL) 30 MG tablet Take 30 mg by mouth daily.    [provider]  naproxen (NAPROSYN) 500 MG tablet Take 1 tablet (500 mg total) by mouth 2 (two) times daily with a meal. 11/16/17   Triplett, Tammy, PA-C    Family History History reviewed. No pertinent family history.  Social History Social History   Tobacco Use  . Smoking status: Current Every Day Smoker    Packs/day: 0.50    Types: Cigarettes  . Smokeless tobacco: Never Used    Substance Use Topics  . Alcohol use: Yes    Comment: socially   . Drug use: No     Allergies   Patient has no known allergies.   Review of Systems Review of Systems  Constitutional: Negative for activity change.       All ROS Neg except as noted in HPI  HENT: Positive for dental problem. Negative for nosebleeds.   Eyes: Negative for photophobia and discharge.  Respiratory: Negative for cough, shortness of breath and wheezing.   Cardiovascular: Negative for chest pain and palpitations.  Gastrointestinal: Negative for abdominal pain and blood in stool.  Genitourinary: Negative for dysuria, frequency and hematuria.  Musculoskeletal: Negative for arthralgias, back pain and neck pain.  Skin: Negative.   Neurological: Negative for dizziness, seizures and speech difficulty.  Psychiatric/Behavioral: Negative for confusion and hallucinations.     Physical Exam Updated Vital Signs BP (!) 191/111   Pulse 98   Temp 98.9 F (37.2 C) (Oral)   Resp 18   Ht 5\' 10"  (1.778 m)   Wt 86.2 kg   SpO2 99%   BMI 27.26 kg/m   Physical Exam Vitals signs and nursing note reviewed.  Constitutional:      Appearance: He is well-developed. He is not toxic-appearing.  HENT:     Head: Normocephalic.     Right Ear: Tympanic membrane and external ear normal.     Left Ear: Tympanic membrane and  external ear normal.     Mouth/Throat:     Comments: There is pain at the right lower molar area.  There is some swelling of the gum.  No visible abscess.  There is a small palpable knot present.  The airway is patent.  Uvula is in the midline. Eyes:     General: Lids are normal.     Pupils: Pupils are equal, round, and reactive to light.  Neck:     Musculoskeletal: Normal range of motion and neck supple.     Vascular: No carotid bruit.  Cardiovascular:     Rate and Rhythm: Normal rate and regular rhythm.     Pulses: Normal pulses.     Heart sounds: Normal heart sounds.  Pulmonary:     Effort: No  respiratory distress.     Breath sounds: Normal breath sounds.  Abdominal:     General: Bowel sounds are normal.     Palpations: Abdomen is soft.     Tenderness: There is no abdominal tenderness. There is no guarding.  Musculoskeletal: Normal range of motion.  Lymphadenopathy:     Head:     Right side of head: No submandibular adenopathy.     Left side of head: No submandibular adenopathy.     Cervical: No cervical adenopathy.  Skin:    General: Skin is warm and dry.  Neurological:     Mental Status: He is alert and oriented to person, place, and time.     Cranial Nerves: No cranial nerve deficit.     Sensory: No sensory deficit.  Psychiatric:        Speech: Speech normal.      ED Treatments / Results  Labs (all labs ordered are listed, but only abnormal results are displayed) Labs Reviewed - No data to display  EKG None  Radiology No results found.  Procedures Procedures (including critical care time)  Medications Ordered in ED Medications  clindamycin (CLEOCIN) capsule 300 mg (300 mg Oral Given 08/03/18 1507)  ibuprofen (ADVIL,MOTRIN) tablet 800 mg (800 mg Oral Given 08/03/18 1507)  acetaminophen (TYLENOL) tablet 1,000 mg (1,000 mg Oral Given 08/03/18 1507)  ondansetron (ZOFRAN) tablet 4 mg (4 mg Oral Given 08/03/18 1508)     Initial Impression / Assessment and Plan / ED Course  I have reviewed the triage vital signs and the nursing notes.  Pertinent labs & imaging results that were available during my care of the patient were reviewed by me and considered in my medical decision making (see chart for details).       Final Clinical Impressions(s) / ED Diagnoses MDM  Blood pressure was elevated on admission.  Pulse oximetry was within normal limits by my interpretation.  Patient states that he has missed a few doses of his blood pressure medication.  And he has not been keeping a close check on his blood pressure.  Patient has multiple dental caries, but worse  on the right lower molar area.  No visible abscess.  No evidence for Ludewig's angina or other emergent changes.  Recheck of blood pressure still elevated.    Patient given a prescription for his previous medications.  Have asked him to take them consistently and to have his pressure rechecked in the next hour 3 to 4 days.  The patient is to return to the emergency department immediately if any changes in his condition, problems, or concerns.  I discussed with the patient the danger of his blood pressure not being under control and  the long-term effects that it can have on the various organs of his body.  Patient acknowledges understanding.   Final diagnoses:  Dental caries  Pain, dental    ED Discharge Orders    None       Ivery QualeBryant, Eve Rey, PA-C 08/04/18 2132    Pricilla LovelessGoldston, Scott, MD 08/05/18 [phone]

## 2020-03-07 ENCOUNTER — Other Ambulatory Visit: Payer: Self-pay

## 2020-03-07 DIAGNOSIS — Z20822 Contact with and (suspected) exposure to covid-19: Secondary | ICD-10-CM

## 2020-03-08 LAB — NOVEL CORONAVIRUS, NAA: SARS-CoV-2, NAA: DETECTED — AB

## 2020-03-08 LAB — SARS-COV-2, NAA 2 DAY TAT

## 2021-04-17 DIAGNOSIS — E119 Type 2 diabetes mellitus without complications: Secondary | ICD-10-CM | POA: Diagnosis not present

## 2021-04-17 DIAGNOSIS — I10 Essential (primary) hypertension: Secondary | ICD-10-CM | POA: Diagnosis not present

## 2021-04-17 DIAGNOSIS — M129 Arthropathy, unspecified: Secondary | ICD-10-CM | POA: Diagnosis not present

## 2021-04-17 DIAGNOSIS — R053 Chronic cough: Secondary | ICD-10-CM | POA: Diagnosis not present

## 2021-05-12 DIAGNOSIS — E1165 Type 2 diabetes mellitus with hyperglycemia: Secondary | ICD-10-CM | POA: Diagnosis not present

## 2021-12-03 DIAGNOSIS — Z202 Contact with and (suspected) exposure to infections with a predominantly sexual mode of transmission: Secondary | ICD-10-CM | POA: Diagnosis not present

## 2021-12-08 ENCOUNTER — Observation Stay (HOSPITAL_COMMUNITY)
Admission: EM | Admit: 2021-12-08 | Discharge: 2021-12-09 | Disposition: A | Discharge status: Home / Self Care | Payer: BC Managed Care – PPO | Attending: Internal Medicine | Admitting: Internal Medicine

## 2021-12-08 ENCOUNTER — Other Ambulatory Visit: Payer: Self-pay

## 2021-12-08 ENCOUNTER — Other Ambulatory Visit (HOSPITAL_COMMUNITY): Payer: BC Managed Care – PPO

## 2021-12-08 ENCOUNTER — Emergency Department (HOSPITAL_COMMUNITY): Payer: BC Managed Care – PPO

## 2021-12-08 ENCOUNTER — Encounter (HOSPITAL_COMMUNITY): Payer: Self-pay

## 2021-12-08 ENCOUNTER — Inpatient Hospital Stay (HOSPITAL_BASED_OUTPATIENT_CLINIC_OR_DEPARTMENT_OTHER): Payer: BC Managed Care – PPO

## 2021-12-08 ENCOUNTER — Other Ambulatory Visit (HOSPITAL_COMMUNITY): Payer: Self-pay | Admitting: Radiology

## 2021-12-08 DIAGNOSIS — I639 Cerebral infarction, unspecified: Secondary | ICD-10-CM | POA: Diagnosis not present

## 2021-12-08 DIAGNOSIS — M4802 Spinal stenosis, cervical region: Secondary | ICD-10-CM | POA: Diagnosis not present

## 2021-12-08 DIAGNOSIS — F17210 Nicotine dependence, cigarettes, uncomplicated: Secondary | ICD-10-CM | POA: Insufficient documentation

## 2021-12-08 DIAGNOSIS — M25512 Pain in left shoulder: Secondary | ICD-10-CM | POA: Diagnosis not present

## 2021-12-08 DIAGNOSIS — Z8669 Personal history of other diseases of the nervous system and sense organs: Secondary | ICD-10-CM | POA: Diagnosis not present

## 2021-12-08 DIAGNOSIS — I10 Essential (primary) hypertension: Secondary | ICD-10-CM | POA: Diagnosis not present

## 2021-12-08 DIAGNOSIS — I6389 Other cerebral infarction: Secondary | ICD-10-CM

## 2021-12-08 DIAGNOSIS — Z8673 Personal history of transient ischemic attack (TIA), and cerebral infarction without residual deficits: Secondary | ICD-10-CM | POA: Diagnosis not present

## 2021-12-08 DIAGNOSIS — M50221 Other cervical disc displacement at C4-C5 level: Secondary | ICD-10-CM | POA: Diagnosis not present

## 2021-12-08 DIAGNOSIS — E1165 Type 2 diabetes mellitus with hyperglycemia: Secondary | ICD-10-CM

## 2021-12-08 DIAGNOSIS — E785 Hyperlipidemia, unspecified: Secondary | ICD-10-CM

## 2021-12-08 DIAGNOSIS — Z79899 Other long term (current) drug therapy: Secondary | ICD-10-CM | POA: Insufficient documentation

## 2021-12-08 DIAGNOSIS — E871 Hypo-osmolality and hyponatremia: Secondary | ICD-10-CM | POA: Insufficient documentation

## 2021-12-08 DIAGNOSIS — Z720 Tobacco use: Secondary | ICD-10-CM

## 2021-12-08 DIAGNOSIS — I6523 Occlusion and stenosis of bilateral carotid arteries: Secondary | ICD-10-CM | POA: Diagnosis not present

## 2021-12-08 DIAGNOSIS — M5030 Other cervical disc degeneration, unspecified cervical region: Secondary | ICD-10-CM | POA: Diagnosis not present

## 2021-12-08 DIAGNOSIS — Z789 Other specified health status: Secondary | ICD-10-CM

## 2021-12-08 DIAGNOSIS — R531 Weakness: Secondary | ICD-10-CM | POA: Diagnosis not present

## 2021-12-08 DIAGNOSIS — M47812 Spondylosis without myelopathy or radiculopathy, cervical region: Secondary | ICD-10-CM

## 2021-12-08 LAB — CBC
HCT: 44.2 % (ref 39.0–52.0)
Hemoglobin: 14.6 g/dL (ref 13.0–17.0)
MCH: 28.4 pg (ref 26.0–34.0)
MCHC: 33 g/dL (ref 30.0–36.0)
MCV: 86 fL (ref 80.0–100.0)
Platelets: 216 10*3/uL (ref 150–400)
RBC: 5.14 MIL/uL (ref 4.22–5.81)
RDW: 12.8 % (ref 11.5–15.5)
WBC: 9 10*3/uL (ref 4.0–10.5)
nRBC: 0 % (ref 0.0–0.2)

## 2021-12-08 LAB — COMPREHENSIVE METABOLIC PANEL
ALT: 39 U/L (ref 0–44)
AST: 43 U/L — ABNORMAL HIGH (ref 15–41)
Albumin: 4.1 g/dL (ref 3.5–5.0)
Alkaline Phosphatase: 67 U/L (ref 38–126)
Anion gap: 7 (ref 5–15)
BUN: 11 mg/dL (ref 6–20)
CO2: 22 mmol/L (ref 22–32)
Calcium: 9.1 mg/dL (ref 8.9–10.3)
Chloride: 101 mmol/L (ref 98–111)
Creatinine, Ser: 0.84 mg/dL (ref 0.61–1.24)
GFR, Estimated: >60 mL/min (ref >60)
Glucose, Bld: 248 mg/dL — ABNORMAL HIGH (ref 70–99)
Potassium: 3.6 mmol/L (ref 3.5–5.1)
Sodium: 130 mmol/L — ABNORMAL LOW (ref 135–145)
Total Bilirubin: 0.7 mg/dL (ref 0.3–1.2)
Total Protein: 8.6 g/dL — ABNORMAL HIGH (ref 6.5–8.1)

## 2021-12-08 LAB — ECHOCARDIOGRAM COMPLETE BUBBLE STUDY
AR max vel: 2.65 cm2
AV Area VTI: 2.56 cm2
AV Area mean vel: 2.44 cm2
AV Mean grad: 4 mmHg
AV Peak grad: 7.3 mmHg
Ao pk vel: 1.35 m/s
Area-P 1/2: 3.68 cm2
MV VTI: 2.08 cm2
S' Lateral: 3.55 cm

## 2021-12-08 LAB — LDL CHOLESTEROL, DIRECT: Direct LDL: 104.7 mg/dL — ABNORMAL HIGH (ref 0–99)

## 2021-12-08 LAB — GLUCOSE, CAPILLARY
Glucose-Capillary: 176 mg/dL — ABNORMAL HIGH (ref 70–99)
Glucose-Capillary: 169 mg/dL — ABNORMAL HIGH (ref 70–99)

## 2021-12-08 LAB — HEMOGLOBIN A1C
Hgb A1c MFr Bld: 8.3 % — ABNORMAL HIGH (ref 4.8–5.6)
Mean Plasma Glucose: 191.51 mg/dL

## 2021-12-08 LAB — TSH: TSH: 2.788 u[IU]/mL (ref 0.350–4.500)

## 2021-12-08 LAB — OSMOLALITY: Osmolality: 290 mOsm/kg (ref 275–295)

## 2021-12-08 LAB — HIV ANTIBODY (ROUTINE TESTING W REFLEX): HIV Screen 4th Generation wRfx: NONREACTIVE

## 2021-12-08 MED ORDER — NICOTINE 21 MG/24HR TD PT24
21.0000 mg | MEDICATED_PATCH | Freq: Once | TRANSDERMAL | Status: AC
  Administered 2021-12-08: 21 mg via TRANSDERMAL
  Filled 2021-12-08: qty 1

## 2021-12-08 MED ORDER — CLOPIDOGREL BISULFATE 75 MG PO TABS
75.0000 mg | ORAL_TABLET | Freq: Every day | ORAL | Status: DC
  Administered 2021-12-08 – 2021-12-09 (×2): 75 mg via ORAL
  Filled 2021-12-08 (×2): qty 1

## 2021-12-08 MED ORDER — ATORVASTATIN CALCIUM 20 MG PO TABS
20.0000 mg | ORAL_TABLET | Freq: Every day | ORAL | Status: DC
  Administered 2021-12-08 – 2021-12-09 (×2): 20 mg via ORAL
  Filled 2021-12-08: qty 1

## 2021-12-08 MED ORDER — THIAMINE HCL 100 MG PO TABS
100.0000 mg | ORAL_TABLET | Freq: Every day | ORAL | Status: DC
  Administered 2021-12-08 – 2021-12-09 (×2): 100 mg via ORAL
  Filled 2021-12-08 (×2): qty 1

## 2021-12-08 MED ORDER — INSULIN ASPART 100 UNIT/ML IJ SOLN
0.0000 [IU] | Freq: Three times a day (TID) | INTRAMUSCULAR | Status: DC
  Administered 2021-12-08: 2 [IU] via SUBCUTANEOUS
  Administered 2021-12-09: 3 [IU] via SUBCUTANEOUS
  Administered 2021-12-09: 2 [IU] via SUBCUTANEOUS

## 2021-12-08 MED ORDER — SENNOSIDES-DOCUSATE SODIUM 8.6-50 MG PO TABS: 1.0000 | ORAL_TABLET | Freq: Every evening | ORAL | Status: DC | PRN

## 2021-12-08 MED ORDER — ACETAMINOPHEN 650 MG RE SUPP: 650.0000 mg | RECTAL | Status: DC | PRN

## 2021-12-08 MED ORDER — THIAMINE HCL 100 MG/ML IJ SOLN: 100.0000 mg | Freq: Every day | INTRAMUSCULAR | Status: DC

## 2021-12-08 MED ORDER — LISINOPRIL 10 MG PO TABS
20.0000 mg | ORAL_TABLET | Freq: Every day | ORAL | Status: DC
  Administered 2021-12-09: 20 mg via ORAL
  Filled 2021-12-08: qty 2

## 2021-12-08 MED ORDER — FOLIC ACID 1 MG PO TABS
1.0000 mg | ORAL_TABLET | Freq: Every day | ORAL | Status: DC
  Administered 2021-12-08 – 2021-12-09 (×2): 1 mg via ORAL
  Filled 2021-12-08 (×2): qty 1

## 2021-12-08 MED ORDER — LORAZEPAM 2 MG/ML IJ SOLN: 1.0000 mg | INTRAMUSCULAR | Status: DC | PRN

## 2021-12-08 MED ORDER — ASPIRIN 81 MG PO CHEW
81.0000 mg | CHEWABLE_TABLET | Freq: Every day | ORAL | Status: DC
  Administered 2021-12-08 – 2021-12-09 (×2): 81 mg via ORAL
  Filled 2021-12-08 (×2): qty 1

## 2021-12-08 MED ORDER — ADULT MULTIVITAMIN W/MINERALS CH
1.0000 | ORAL_TABLET | Freq: Every day | ORAL | Status: DC
  Administered 2021-12-08 – 2021-12-09 (×2): 1 via ORAL
  Filled 2021-12-08 (×2): qty 1

## 2021-12-08 MED ORDER — STROKE: EARLY STAGES OF RECOVERY BOOK
Freq: Once | Status: AC
  Filled 2021-12-08: qty 1

## 2021-12-08 MED ORDER — ACETAMINOPHEN 160 MG/5ML PO SOLN: 650.0000 mg | ORAL | Status: DC | PRN

## 2021-12-08 MED ORDER — ACETAMINOPHEN 325 MG PO TABS: 650.0000 mg | ORAL_TABLET | ORAL | Status: DC | PRN

## 2021-12-08 MED ORDER — LORAZEPAM 1 MG PO TABS: 1.0000 mg | ORAL_TABLET | ORAL | Status: DC | PRN

## 2021-12-08 MED ORDER — LORAZEPAM 1 MG PO TABS
1.0000 mg | ORAL_TABLET | Freq: Once | ORAL | Status: AC | PRN
  Administered 2021-12-08: 1 mg via ORAL
  Filled 2021-12-08: qty 1

## 2021-12-08 MED ORDER — HYDRALAZINE HCL 20 MG/ML IJ SOLN: 10.0000 mg | Freq: Three times a day (TID) | INTRAMUSCULAR | Status: DC | PRN

## 2021-12-08 NOTE — ED Notes (Signed)
Patient transported to MRI 

## 2021-12-08 NOTE — ED Notes (Signed)
Patient transported to X-ray 

## 2021-12-08 NOTE — Assessment & Plan Note (Signed)
MR-Cspine--mild to moderate left foraminal narrowing C3-4 and mild spinal stenosis C4-5.  There was abnormal signal at C5.  This is indeterminate.  -Also has abnormal BM signal in the C5 vertebral body, indeterminate. F/u outpatient

## 2021-12-08 NOTE — Assessment & Plan Note (Signed)
Untreated HTN, was on lisinopril in the past with no issues Will start this back tomorrow at 20mg  daily  Hydralazine prn  SW consult for PCP placement

## 2021-12-08 NOTE — Assessment & Plan Note (Signed)
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

## 2021-12-08 NOTE — ED Triage Notes (Signed)
Pt states he woke up last Tuesday and Left shoulder was hurting. Denies injury. Has been hurting on and off and movement is restricted, endorses loss of grip function in hand as well.  Has been taking 500 mg X 2 ibuprofen for pain, states it helps some.

## 2021-12-08 NOTE — Assessment & Plan Note (Signed)
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  -admit on telemetry for stroke work-up -Neurochecks per protocol -Neurology consulted -MRI brain without contrast  -echo pending -A1C/lipid panel -start daily aspirin 81 mg and start Plavix 75 mg x 3 weeks neurology recommendation, then ASA alone.  -Permissive hypertension first 24 hours <220/110-outside of this window. Will start lisinopril 20mg  tomorrow. Has been on in the past with no SE. Hydralazine PRN  -N.p.o. until bedside swallow screen -PT/ OT/ SLP consult

## 2021-12-08 NOTE — ED Provider Notes (Signed)
[facility] EMERGENCY DEPARTMENT Provider Note   CSN: [id number] Arrival date & time: 12/08/21  1022     History  Chief Complaint  Patient presents with   Shoulder Pain    Left    Cameron [name] is a 56 y.o. male who presents emergency department with complaint of left arm weakness.  Patient states that this past Wednesday after working overnight he woke up with weakness of his left arm.  He states that over the past several days his arm has become progressively more weak.  He states that he still had to work but has been using his right arm.  He at first noticed a little bit of pain in his left shoulder but that quickly resolved.  He denies any significant neck pain.  He thinks he may have some weakness in the left lower extremity because he states that occasionally it feels like it is dragging a little bit however he has no other neurologic complaints including headache, changes in vision, difficulty with speech or swallowing.  He denies a history of shoulder problems.  He has a past medical history of hypertension   Shoulder Pain     Home Medications Prior to Admission medications   Medication Sig Start Date End Date Taking? Authorizing Provider  clindamycin (CLEOCIN) 150 MG capsule Take 1 capsule (150 mg total) by mouth every 6 (six) hours. 08/03/18   Ivery Quale, PA-C  hydrochlorothiazide (HYDRODIURIL) 25 MG tablet Take 1 tablet (25 mg total) by mouth daily. 11/16/17   Triplett, Tammy, PA-C  HYDROcodone-acetaminophen (NORCO) 7.5-325 MG tablet Take 1 tablet by mouth every 6 (six) hours as needed for moderate pain (Must last 30 days). 11/30/17   Darreld Mclean, MD  ibuprofen (ADVIL,MOTRIN) 600 MG tablet Take 1 tablet (600 mg total) by mouth 4 (four) times daily. 08/03/18   Ivery Quale, PA-C  lisinopril (PRINIVIL,ZESTRIL) 30 MG tablet Take 30 mg by mouth daily.    [provider]  naproxen (NAPROSYN) 500 MG tablet Take 1 tablet (500 mg total) by mouth 2 (two) times daily  with a meal. 11/16/17   Triplett, Tammy, PA-C  traMADol (ULTRAM) 50 MG tablet Take 1 tablet (50 mg total) by mouth every 6 (six) hours as needed. 08/03/18   Ivery Quale, PA-C      Allergies    Patient has no known allergies.    Review of Systems   Review of Systems  Physical Exam Updated Vital Signs BP (!) 153/99 (BP Location: Left Arm)   Pulse 90   Temp 98.4 F (36.9 C) (Oral)   Resp 16   Ht 5\' 10"  (1.778 m)   Wt 79.4 kg   SpO2 98%   BMI 25.11 kg/m  Physical Exam Vitals and nursing note reviewed.  Constitutional:      General: He is not in acute distress.    Appearance: He is well-developed. He is not diaphoretic.  HENT:     Head: Normocephalic and atraumatic.  Eyes:     General: No visual field deficit or scleral icterus.    Conjunctiva/sclera: Conjunctivae normal.  Cardiovascular:     Rate and Rhythm: Normal rate and regular rhythm.     Heart sounds: Normal heart sounds.  Pulmonary:     Effort: Pulmonary effort is normal. No respiratory distress.     Breath sounds: Normal breath sounds.  Abdominal:     Palpations: Abdomen is soft.     Tenderness: There is no abdominal tenderness.  Musculoskeletal:  Cervical back: Normal range of motion and neck supple.  Skin:    General: Skin is warm and dry.  Neurological:     Mental Status: He is alert and oriented to person, place, and time.     Cranial Nerves: No cranial nerve deficit, dysarthria or facial asymmetry.     Sensory: Sensation is intact.     Motor: Weakness present. No tremor, atrophy or abnormal muscle tone.     Gait: Gait normal.     Comments: 5 out of 5 strength of the right arm right leg and left lower extremity. Patient is unable to mobilize the left arm fully.  He does seem to have 5 out of 5 strength with flexion and extension at the elbow however is unable to flex or extend the elbow, abduct the fingers in his weak grip strength on the left side.  No obvious clonus on exam  Psychiatric:         Behavior: Behavior normal.    ED Results / Procedures / Treatments   Labs (all labs ordered are listed, but only abnormal results are displayed) Labs Reviewed - No data to display  EKG None  Radiology DG Shoulder Left  Result Date: 12/08/2021 CLINICAL DATA:  Acute LEFT shoulder pain today. No known injury. Initial encounter. EXAM: LEFT SHOULDER - 2+ VIEW COMPARISON:  11/16/2017 FINDINGS: There is no evidence of fracture or dislocation. There is no evidence of arthropathy or other focal bone abnormality. Soft tissues are unremarkable. IMPRESSION: Negative. Electronically Signed   By: Harmon Pier M.D.   On: 12/08/2021 11:18    Procedures .Critical Care Performed by: Arthor Captain, PA-C Authorized by: Arthor Captain, PA-C   Critical care provider statement:    Critical care time (minutes):  30   Critical care time was exclusive of:  Separately billable procedures and treating other patients   Critical care was necessary to treat or prevent imminent or life-threatening deterioration of the following conditions:  CNS failure or compromise   Critical care was time spent personally by me on the following activities:  Development of treatment plan with patient or surrogate, discussions with consultants, evaluation of patient's response to treatment, examination of patient, ordering and review of laboratory studies, ordering and review of radiographic studies, ordering and performing treatments and interventions, pulse oximetry, re-evaluation of patient's condition and review of old charts    Medications Ordered in ED Medications - No data to display  ED Course/ Medical Decision Making/ A&P Clinical Course as of 12/09/21 1256  Mon Dec 08, 2021  1254 Glucose(!): 248 [AH]    Clinical Course User Index [AH] Arthor Captain, PA-C                           Medical Decision Making 56 year old male who presents the emergency department with unilateral left upper extremity  weakness. Differential diagnosis includes acute stroke, brain mass, cervical myelopathy. After review of all data points the patient appears to have an acute stroke as the cause of his upper extremity weakness. Additional history obtained from patient's ex-girlfriend and family member states that he has had a known diagnosis of diabetes, hypertension, hyperlipidemia and is a daily smoker and has chosen to ignore such diagnoses for the past 13 years despite pleads from his family.  Patient given a nicotine patch.  I discussed the case with Dr. Bing Neighbors of neurology who has placed orders and recommends admission.  Case also discussed with Dr.  Lowella FairyAllison Wolf who will admit the patient.  Amount and/or Complexity of Data Reviewed Labs: ordered. Decision-making details documented in ED Course.    Details: , CBC without abnormalityI ordered and interpreted labs including CMP which shows a glucose of 248 Radiology: ordered.    Details: I ordered, visualized and interpreted imaging of the left shoulder which shows no acute findings, MRI of the brain which shows acute infarct, MRI of the cervical spine shows some stenosis and abnormal marrow singling at C5 per radiologic interpretation which may need further evaluation in the outpatient setting ECG/medicine tests: ordered. Discussion of management or test interpretation with external provider(s): Dr. Selina CooleyStack, Dr. Sheppard PentonWolf  Risk OTC drugs. Prescription drug management. Decision regarding hospitalization.           Final Clinical Impression(s) / ED Diagnoses Final diagnoses:  None    Rx / DC Orders ED Discharge Orders     None         Arthor CaptainHarris, Albina Gosney, PA-C 12/09/21 1300    Gloris Manchesterixon, Ryan, MD 12/13/21 [phone]

## 2021-12-08 NOTE — Progress Notes (Signed)
Neurology progress note  Contacted by ED PA about this 56 yo patient with hx HTN who presented with acute onset LUE weakness and was found to have acute R PLIC ischemic infarct on MRI brain. MRA head showed no LVO or significant intracranial stenosis. CNS imaging personally reviewed.  Recommendations: - Admit to hospitalist service for stroke w/u - OK to admit to [facility] - Permissive HTN x48 hrs from sx onset goal BP <220/110. PRN labetalol or hydralazine if BP above these parameters. Avoid oral antihypertensives. After 48 hrs goal is normotension, avoid hypotension. - Carotid US - TTE w/ bubble - Check A1c and LDL + add statin per guidelines - ASA 81mg  daily + plavix 75mg  daily x21 days f/b ASA 81mg  daily monotherapy after that - q4 hr neuro checks - STAT head CT for any change in neuro exam - Tele - PT/OT/SLP - Stroke education - Amb referral to neurology upon discharge  If formal teleneurology consult is indicated in opinion of admitting hospitalist, please consult Dr. (weekdays 8am-4pm; pager # listed under neurology on amion).  , MD [facility] [phone]  If 7pm- 7am, please page neurology on call as listed in AMION.

## 2021-12-08 NOTE — Assessment & Plan Note (Signed)
A1C 8.3, was on metformin in past -d/c home with metformin and glipizide Start SSI and accuchecks qac/hs TOC consult for PCP Diabetic education

## 2021-12-08 NOTE — ED Notes (Signed)
Patient transported to Ultrasound 

## 2021-12-08 NOTE — Assessment & Plan Note (Signed)
Drinks 6 pack/night, no hx of withdrawals Start CIWA protocol MV, thiamine and folic acid Encouraged him to slowly decrease this amount

## 2021-12-08 NOTE — Assessment & Plan Note (Signed)
Nicotine patch, encouraged cessation 

## 2021-12-08 NOTE — ED Notes (Signed)
Ct brought pt back due to not having IV access. This nurse inserted a 20 g IV in L AC, pt refused R AC due to area being painful after lab draws.

## 2021-12-08 NOTE — H&P (Signed)
History and Physical    Patient: Cameron [name] PPI:[id number] DOB: 03/07/1966 DOA: 12/08/2021 DOS: the patient was seen and examined on 12/08/2021 PCP: Elfredia Nevins, MD  Patient coming from: Home - lives with roommate and his mother    Chief Complaint: LUE weakness   HPI: Cameron [name] is a 56 y.o. male with medical history significant of HTN, untreated T2DM, HLD, tobacco abuse who presented to ED with complaints of acute onset LUE weakness.  Symptoms started on Tuesday. He states he woke up and his left hand felt funny. It still had strength. He states as the days went on he noticed weakness in his left hand and upper arm.  He worked through Saturday night and states the weakness would wax and wane.   Sometimes it felt stronger and then sometimes it was so weak he wasn't able to do anything.  His girlfriend really encouraged him to come to ED today. No complaints with lower leg weakness. No focal deficits, speech abnormality or confusion.   He is right handed.   He has been feeling good. Denies any fever/chills, vision changes/headaches, chest pain or palpitations, shortness of breath or cough, abdominal pain, N/V/D, dysuria or leg swelling.    He does smoke (1/2-1ppd) and does drink alcohol. Drinks 6 pack beer/day . He does have FH of strokes in his father, great aunts/uncles.   ER Course:  vitals: afebrile, bp: 153/99, HR: 90, RR: 16 oxygen: 98%RA Pertinent labs: sodium: 130, glucose: 248, AST: 43,  MRI brain: small acute infarct with involvement of posterior limb of the right internal capsule. Mild to moderate chronic microvascular ischemic changes. Additional chronic small vessel infarcts of right basal ganglia and adjacent white matter.  MRA head: no proximal intracranial vessel occlusion or significant stenosis  Cervical MRI: moderate spinal stenosis and mild to moderate left neural foraminal stenosis at C3-4. Mild spinal stenosis and moderate to severe left neural foraminal  stenosis at C4-C5. Abnormal marrow signal in the C5 vertebral body, indeterminate with possibilities including both benign etiologies (such as an atypical hemangioma) and metastatic disease. If there is a history of malignancy, consider a nuclear medicine bone scan for further evaluation. Cervical spine CT may be helpful to evaluate for any bone destruction. In ED: neurology consulted. Given ASA+plavix.    Review of Systems: As mentioned in the history of present illness. All other systems reviewed and are negative. Past Medical History:  Diagnosis Date   Chronic shoulder pain    Hypertension    No past surgical history on file. Social History:  reports that he has been smoking cigarettes. He has been smoking an average of .5 packs per day. He has never used smokeless tobacco. He reports current alcohol use. He reports that he does not use drugs.  No Known Allergies  No family history on file.  Prior to Admission medications   Not on File    Physical Exam: Vitals:   12/08/21 1050 12/08/21 1227 12/08/21 1422 12/08/21 1720  BP: (!) 153/99 (!) 162/113 (!) 183/99 (!) 146/96  Pulse: 90 73 73 75  Resp: Temp: 98.4 F (36.9 C)   98.3 F (36.8 C)  TempSrc: Oral   Oral  SpO2: 98% 100% 100% 100%  Weight:      Height:       General:  Appears calm and comfortable and is in NAD Eyes:  PERRL, EOMI, normal lids, iris ENT:  grossly normal hearing, lips & tongue, mmm; appropriate  dentition Neck:  no LAD, masses or thyromegaly; no carotid bruits Cardiovascular:  RRR, no m/r/g. No LE edema.  Respiratory:   CTA bilaterally with no wheezes/rales/rhonchi.  Normal respiratory effort. Abdomen:  soft, NT, ND, NABS Back:   normal alignment, no CVAT Skin:  no rash or induration seen on limited exam Musculoskeletal:  grossly normal tone BLE. Right UE 5/5, left UE: 3-4/5, decreased hand grip.  good ROM, no bony abnormality Lower extremity:  No LE edema.  Limited foot exam with no  ulcerations.  2+ distal pulses. Psychiatric:  grossly normal mood and affect, speech fluent and appropriate, AOx3 Neurologic:  CN 2-10, XII grossly intact, CN XI decreased strength on left. moves all extremities in coordinated fashion, sensation intact. HTK intact bilaterally. FTN intact on right, slow, but intact on left. Negative pronator drift. Gait deferred.    Radiological Exams on Admission: Independently reviewed - see discussion in A/P where applicable  MR ANGIO HEAD WO CONTRAST  Result Date: 12/08/2021 CLINICAL DATA:  Neuro deficit, acute, stroke suspected left arm weakness EXAM: MRI HEAD WITHOUT CONTRAST MRA HEAD WITHOUT CONTRAST TECHNIQUE: Multiplanar, multi-echo pulse sequences of the brain and surrounding structures were acquired without intravenous contrast. Angiographic images of the Circle of Willis were acquired using MRA technique without intravenous contrast. COMPARISON:  None Available. FINDINGS: MRI HEAD Brain: 1.6 cm focus of restricted diffusion with involvement of the posterior limb of the internal capsule. Ventricles and sulci are normal in size and configuration. Patchy and confluent areas of T2 hyperintensity in the supratentorial and pontine white matter are nonspecific but may reflect mild to moderate chronic microvascular ischemic changes. There are additional chronic small vessel infarcts of the right basal ganglia and adjacent white matter. No intracranial mass or mass effect. No hydrocephalus or extra-axial collection. Vascular: Major vessel flow voids at the skull base are preserved. Skull and upper cervical spine: Normal marrow signal is preserved. Cervical spine degenerative changes. Sinuses/Orbits: Paranasal sinuses are aerated. Orbits are unremarkable. Other: Sella is unremarkable.  Mastoid air cells are clear. MRA HEAD Intracranial internal carotid arteries are patent with atherosclerotic irregularity. Middle and anterior cerebral arteries are patent. Intracranial  vertebral arteries, basilar artery, posterior cerebral arteries are patent. Bilateral posterior communicating arteries are present. There is no significant stenosis or aneurysm. IMPRESSION: Small acute infarct with involvement of the posterior limb of the right internal capsule. Mild to moderate chronic microvascular ischemic changes. Additional chronic small vessel infarcts of the right basal ganglia and adjacent white matter. No proximal intracranial vessel occlusion or significant stenosis. Electronically Signed   By: Guadlupe Spanish M.D.   On: 12/08/2021 13:01   MR BRAIN WO CONTRAST  Result Date: 12/08/2021 CLINICAL DATA:  Neuro deficit, acute, stroke suspected left arm weakness EXAM: MRI HEAD WITHOUT CONTRAST MRA HEAD WITHOUT CONTRAST TECHNIQUE: Multiplanar, multi-echo pulse sequences of the brain and surrounding structures were acquired without intravenous contrast. Angiographic images of the Circle of Willis were acquired using MRA technique without intravenous contrast. COMPARISON:  None Available. FINDINGS: MRI HEAD Brain: 1.6 cm focus of restricted diffusion with involvement of the posterior limb of the internal capsule. Ventricles and sulci are normal in size and configuration. Patchy and confluent areas of T2 hyperintensity in the supratentorial and pontine white matter are nonspecific but may reflect mild to moderate chronic microvascular ischemic changes. There are additional chronic small vessel infarcts of the right basal ganglia and adjacent white matter. No intracranial mass or mass effect. No hydrocephalus or extra-axial collection. Vascular: Major vessel  flow voids at the skull base are preserved. Skull and upper cervical spine: Normal marrow signal is preserved. Cervical spine degenerative changes. Sinuses/Orbits: Paranasal sinuses are aerated. Orbits are unremarkable. Other: Sella is unremarkable.  Mastoid air cells are clear. MRA HEAD Intracranial internal carotid arteries are patent with  atherosclerotic irregularity. Middle and anterior cerebral arteries are patent. Intracranial vertebral arteries, basilar artery, posterior cerebral arteries are patent. Bilateral posterior communicating arteries are present. There is no significant stenosis or aneurysm. IMPRESSION: Small acute infarct with involvement of the posterior limb of the right internal capsule. Mild to moderate chronic microvascular ischemic changes. Additional chronic small vessel infarcts of the right basal ganglia and adjacent white matter. No proximal intracranial vessel occlusion or significant stenosis. Electronically Signed   By: Guadlupe Spanish M.D.   On: 12/08/2021 13:01   MR CERVICAL SPINE WO CONTRAST  Result Date: 12/08/2021 CLINICAL DATA:  Myelopathy, acute, cervical spine. Left arm weakness. EXAM: MRI CERVICAL SPINE WITHOUT CONTRAST TECHNIQUE: Multiplanar, multisequence MR imaging of the cervical spine was performed. No intravenous contrast was administered. COMPARISON:  None Available. FINDINGS: Alignment: Straightening of the normal cervical lordosis. No listhesis. Vertebrae: No fracture. Abnormal T1 hypointensity and T2/STIR hyperintensity throughout the C5 vertebral body. Cord: Normal signal. Posterior Fossa, vertebral arteries, paraspinal tissues: Posterior fossa more fully evaluated on today's separate head MRI. Preserved vertebral artery flow voids. No paraspinal fluid collection. Disc levels: C2-3: Mild left uncovertebral spurring and moderate left facet arthrosis result in moderate left neural foraminal stenosis without spinal stenosis. C3-4: Disc bulging and uncovertebral spurring result in moderate spinal stenosis with mild ventral cord flattening and mild right and mild-to-moderate left neural foraminal stenosis. C4-5: Disc bulging, a small central disc protrusion, uncovertebral spurring, and severe left facet arthrosis result in mild spinal stenosis and moderate to severe left neural foraminal stenosis. C5-6:  Mild left eccentric disc bulging and uncovertebral spurring without significant stenosis. C6-7: Mild disc bulging without significant stenosis. C7-T1: Mild left facet arthrosis without stenosis. IMPRESSION: 1. Moderate spinal stenosis and mild-to-moderate left neural foraminal stenosis at C3-4. 2. Mild spinal stenosis and moderate to severe left neural foraminal stenosis at C4-5. 3. Abnormal marrow signal in the C5 vertebral body, indeterminate with possibilities including both benign etiologies (such as an atypical hemangioma) and metastatic disease. If there is a history of malignancy, consider a nuclear medicine bone scan for further evaluation. Cervical spine CT may be helpful to evaluate for any bone destruction. Electronically Signed   By: Sebastian Ache M.D.   On: 12/08/2021 13:25   US Carotid Bilateral  Result Date: 12/08/2021 CLINICAL DATA:  Stroke.  History of hypertension and smoking. EXAM: BILATERAL CAROTID DUPLEX ULTRASOUND TECHNIQUE: Wallace Cullens scale imaging, color Doppler and duplex ultrasound were performed of bilateral carotid and vertebral arteries in the neck. COMPARISON:  None Available. FINDINGS: Criteria: Quantification of carotid stenosis is based on velocity parameters that correlate the residual internal carotid diameter with NASCET-based stenosis levels, using the diameter of the distal internal carotid lumen as the denominator for stenosis measurement. The following velocity measurements were obtained: RIGHT ICA: 91/29 cm/sec CCA: 74/19 cm/sec SYSTOLIC ICA/CCA RATIO:  1.2 ECA: 65 cm/sec LEFT ICA: 75/29 cm/sec CCA: 79/17 cm/sec SYSTOLIC ICA/CCA RATIO:  0.9 ECA: 63 cm/sec RIGHT CAROTID ARTERY: There is a minimal amount of hypoechoic plaque within the right carotid bulb (image 17), not resulting in elevated peak systolic velocities within the interrogated course of the right internal carotid artery to suggest a hemodynamically significant stenosis. RIGHT VERTEBRAL ARTERY:  Antegrade flow LEFT  CAROTID ARTERY: There is a minimal amount of intimal thickening/atherosclerotic plaque involving the origin and proximal aspects of the left internal carotid artery (images 58 and 60), not resulting in elevated peak systolic velocities within the interrogated course of the left internal carotid artery to suggest a hemodynamically significant stenosis. LEFT VERTEBRAL ARTERY:  Antegrade flow IMPRESSION: Minimal amount of bilateral atherosclerotic plaque, left greater than right, not resulting in a hemodynamically significant stenosis within either internal carotid artery. Electronically Signed   By: Simonne Come M.D.   On: 12/08/2021 15:30   DG Shoulder Left  Result Date: 12/08/2021 CLINICAL DATA:  Acute LEFT shoulder pain today. No known injury. Initial encounter. EXAM: LEFT SHOULDER - 2+ VIEW COMPARISON:  11/16/2017 FINDINGS: There is no evidence of fracture or dislocation. There is no evidence of arthropathy or other focal bone abnormality. Soft tissues are unremarkable. IMPRESSION: Negative. Electronically Signed   By: Harmon Pier M.D.   On: 12/08/2021 11:18   ECHOCARDIOGRAM COMPLETE BUBBLE STUDY  Result Date: 12/08/2021    ECHOCARDIOGRAM REPORT   Patient Name:   JOSEDANIEL HAYE Date of Exam: 12/08/2021 Medical Rec #:  [id number]      Height:       70.0 in Accession #:    [id number]     Weight:       175.0 lb Date of Birth:  1966-01-08      BSA:          1.972 m Patient Age:    56 years       BP:           162/113 mmHg Patient Gender: M              HR:           76 bpm. [facility] Procedure: 2D Echo, Cardiac Doppler, Color Doppler and Saline Contrast Bubble            Study Indications:   Stroke  History:       Patient has no prior history of Echocardiogram examinations.                Stroke.  Sonographer:   Mikki Harbor Referring      [id number] Malachi Carl STACK Phys: IMPRESSIONS  1. Left ventricular ejection fraction, by estimation, is 55 to 60%. The left ventricle has normal function. The  left ventricle has no regional wall motion abnormalities. There is mild left ventricular hypertrophy. Left ventricular diastolic parameters were normal.  2. Right ventricular systolic function is normal. The right ventricular size is mildly enlarged. Tricuspid regurgitation signal is inadequate for assessing PA pressure.  3. The mitral valve is normal in structure. Trivial mitral valve regurgitation. No evidence of mitral stenosis.  4. The aortic valve is tricuspid. Aortic valve regurgitation is not visualized. No aortic stenosis is present.  5. The inferior vena cava is normal in size with greater than 50% respiratory variability, suggesting right atrial pressure of 3 mmHg.  6. Agitated saline contrast bubble study was negative, with no evidence of any interatrial shunt. FINDINGS  Left Ventricle: Left ventricular ejection fraction, by estimation, is 55 to 60%. The left ventricle has normal function. The left ventricle has no regional wall motion abnormalities. The left ventricular internal cavity size was normal in size. There is  mild left ventricular hypertrophy. Left ventricular diastolic parameters were normal. Right Ventricle: The right ventricular size is mildly enlarged. No increase in right ventricular wall thickness.  Right ventricular systolic function is normal. Tricuspid regurgitation signal is inadequate for assessing PA pressure. Left Atrium: Left atrial size was normal in size. Right Atrium: Right atrial size was normal in size. Pericardium: There is no evidence of pericardial effusion. Mitral Valve: The mitral valve is normal in structure. Trivial mitral valve regurgitation. No evidence of mitral valve stenosis. MV peak gradient, 3.2 mmHg. The mean mitral valve gradient is 1.0 mmHg. Tricuspid Valve: The tricuspid valve is normal in structure. Tricuspid valve regurgitation is not demonstrated. Aortic Valve: The aortic valve is tricuspid. Aortic valve regurgitation is not visualized. No aortic stenosis  is present. Aortic valve mean gradient measures 4.0 mmHg. Aortic valve peak gradient measures 7.3 mmHg. Aortic valve area, by VTI measures 2.56 cm. Pulmonic Valve: The pulmonic valve was grossly normal. Pulmonic valve regurgitation is not visualized. Aorta: The aortic root is normal in size and structure. Venous: The inferior vena cava is normal in size with greater than 50% respiratory variability, suggesting right atrial pressure of 3 mmHg. IAS/Shunts: The interatrial septum was not well visualized. Agitated saline contrast was given intravenously to evaluate for intracardiac shunting. Agitated saline contrast bubble study was negative, with no evidence of any interatrial shunt.  LEFT VENTRICLE PLAX 2D LVIDd:         4.70 cm   Diastology LVIDs:         3.55 cm   LV e' medial:    7.07 cm/s LV PW:         1.20 cm   LV E/e' medial:  12.2 LV IVS:        1.20 cm   LV e' lateral:   9.57 cm/s LVOT diam:     2.00 cm   LV E/e' lateral: 9.0 LV SV:         71 LV SV Index:   36 LVOT Area:     3.14 cm  RIGHT VENTRICLE RV Basal diam:  3.75 cm RV Mid diam:    2.90 cm RV S prime:     12.80 cm/s TAPSE (M-mode): 2.1 cm LEFT ATRIUM             Index        RIGHT ATRIUM           Index LA diam:        3.30 cm 1.67 cm/m   RA Area:     16.80 cm LA Vol (A2C):   54.3 ml 27.53 ml/m  RA Volume:   49.30 ml  25.00 ml/m LA Vol (A4C):   42.0 ml 21.30 ml/m LA Biplane Vol: 47.5 ml 24.09 ml/m  AORTIC VALVE                    PULMONIC VALVE AV Area (Vmax):    2.65 cm     PV Vmax:       0.89 m/s AV Area (Vmean):   2.44 cm     PV Peak grad:  3.2 mmHg AV Area (VTI):     2.56 cm AV Vmax:           135.00 cm/s AV Vmean:          82.700 cm/s AV VTI:            0.279 m AV Peak Grad:      7.3 mmHg AV Mean Grad:      4.0 mmHg LVOT Vmax:         114.00 cm/s LVOT Vmean:  64.100 cm/s LVOT VTI:          0.227 m LVOT/AV VTI ratio: 0.81  AORTA Ao Root diam: 3.20 cm MITRAL VALVE MV Area (PHT): 3.68 cm    SHUNTS MV Area VTI:   2.08 cm     Systemic VTI:  0.23 m MV Peak grad:  3.2 mmHg    Systemic Diam: 2.00 cm MV Mean grad:  1.0 mmHg MV Vmax:       0.90 m/s MV Vmean:      52.6 cm/s MV Decel Time: 206 msec MV E velocity: 86.50 cm/s MV A velocity: 72.00 cm/s MV E/A ratio:  1.20 Epifanio Lesches MD Electronically signed by Epifanio Lesches MD Signature Date/Time: 12/08/2021/4:52:59 PM    Final     EKG: pending    Labs on Admission: I have personally reviewed the available labs and imaging studies at the time of the admission.  Pertinent labs:     Assessment and Plan: Principal Problem:   CVA (cerebral vascular accident) (HCC) Active Problems:   Cervical spine degeneration   HTN (hypertension)   Hyponatremia   Uncontrolled type 2 diabetes mellitus with hyperglycemia, without long-term current use of insulin (HCC)   Hyperlipidemia   Alcohol use   Tobacco abuse    Assessment and Plan: * CVA (cerebral vascular accident) (HCC) 56 year old male with history of untreated HTN, T2DM, HLD, tobacco abuse and family history of CVA presenting with 6 day history of LUE weakness found to have small acute infarct with involvement of the posterior limb of the right internal capsule.  -admit on telemetry for stroke work-up -Neurochecks per protocol -Neurology consulted -MRI brain without contrast  -echo pending -A1C/lipid panel -start daily aspirin 81 mg and start Plavix 75 mg x 3 weeks neurology recommendation, then ASA alone.  -Permissive hypertension first 24 hours <220/110-outside of this window. Will start lisinopril 20mg  tomorrow. Has been on in the past with no SE. Hydralazine PRN  -N.p.o. until bedside swallow screen -PT/ OT/ SLP consult   Cervical spine degeneration Has moderate to severe spinal stenosis in cervical spine.  Also has abnormal BM signal in the C5 vertebral body, indeterminate. F/u outpatient  Hyponatremia Likely secondary to beer potomania and corrects to 132 with hyperglycemia Will check urine  studies, TSH, osmolality Advised to decrease alcohol intake.    HTN (hypertension) Untreated HTN, was on lisinopril in the past with no issues Will start this back tomorrow at 20mg  daily  Hydralazine prn  SW consult for PCP placement   Uncontrolled type 2 diabetes mellitus with hyperglycemia, without long-term current use of insulin (HCC) A1C 8.3, was on metformin in past and likely can be discharged on this outpatient with close f/u for management with PCP Start SSI and accuchecks qac/hs SW consult for PCP placement Diabetic education   Hyperlipidemia Untreated, LDL pending. Will put in for full lipid panel Goal LDL <70, will start therapy with lipitor   Alcohol use Drinks 6 pack/night, no hx of withdrawals Start CIWA protocol MV, thiamine and folic acid Encouraged him to slowly decrease this amount   Tobacco abuse Nicotine patch, encouraged cessation     Advance Care Planning:   Code Status: Full Code   Consults: neurology in ED   DVT Prophylaxis: SCDs  Family Communication: sister and , friend.   Severity of Illness: The appropriate patient status for this patient is INPATIENT. Inpatient status is judged to be reasonable and necessary in order to provide the required  intensity of service to ensure the patient's safety. The patient's presenting symptoms, physical exam findings, and initial radiographic and laboratory data in the context of their chronic comorbidities is felt to place them at high risk for further clinical deterioration. Furthermore, it is not anticipated that the patient will be medically stable for discharge from the hospital within 2 midnights of admission.   * I certify that at the point of admission it is my clinical judgment that the patient will require inpatient hospital care spanning beyond 2 midnights from the point of admission due to high intensity of service, high risk for further deterioration and high frequency  of surveillance required.*  Author: Orland Mustard, MD 12/08/2021 5:37 PM  For on call review [email or web address].

## 2021-12-08 NOTE — ED Notes (Signed)
Pt transported for bubble study.

## 2021-12-08 NOTE — ED Notes (Signed)
Pt refused IV insertion at this time, stated if he needed one for medications he would accept it later, however since no IV medications are ordered he would prefer not to have one.

## 2021-12-08 NOTE — Assessment & Plan Note (Signed)
Untreated, LDL pending. Will put in for full lipid panel Goal LDL <70, will start therapy with lipitor

## 2021-12-08 NOTE — Progress Notes (Signed)
*  PRELIMINARY RESULTS* Echocardiogram 2D Echocardiogram has been performed.  Cameron [name] 12/08/2021, 4:20 PM

## 2021-12-09 DIAGNOSIS — E1165 Type 2 diabetes mellitus with hyperglycemia: Secondary | ICD-10-CM | POA: Diagnosis not present

## 2021-12-09 DIAGNOSIS — Z720 Tobacco use: Secondary | ICD-10-CM | POA: Diagnosis not present

## 2021-12-09 DIAGNOSIS — E871 Hypo-osmolality and hyponatremia: Secondary | ICD-10-CM

## 2021-12-09 DIAGNOSIS — I10 Essential (primary) hypertension: Secondary | ICD-10-CM | POA: Diagnosis not present

## 2021-12-09 DIAGNOSIS — Z789 Other specified health status: Secondary | ICD-10-CM

## 2021-12-09 DIAGNOSIS — E782 Mixed hyperlipidemia: Secondary | ICD-10-CM | POA: Diagnosis not present

## 2021-12-09 DIAGNOSIS — I639 Cerebral infarction, unspecified: Secondary | ICD-10-CM | POA: Diagnosis not present

## 2021-12-09 LAB — BASIC METABOLIC PANEL
Anion gap: 8 (ref 5–15)
BUN: 10 mg/dL (ref 6–20)
CO2: 24 mmol/L (ref 22–32)
Calcium: 8.9 mg/dL (ref 8.9–10.3)
Chloride: 103 mmol/L (ref 98–111)
Creatinine, Ser: 0.86 mg/dL (ref 0.61–1.24)
GFR, Estimated: >60 mL/min (ref >60)
Glucose, Bld: 175 mg/dL — ABNORMAL HIGH (ref 70–99)
Potassium: 3.7 mmol/L (ref 3.5–5.1)
Sodium: 135 mmol/L (ref 135–145)

## 2021-12-09 LAB — RAPID URINE DRUG SCREEN, HOSP PERFORMED
Amphetamines: NOT DETECTED
Barbiturates: NOT DETECTED
Benzodiazepines: NOT DETECTED
Cocaine: POSITIVE — AB
Opiates: NOT DETECTED
Tetrahydrocannabinol: POSITIVE — AB

## 2021-12-09 LAB — GLUCOSE, CAPILLARY
Glucose-Capillary: 225 mg/dL — ABNORMAL HIGH (ref 70–99)
Glucose-Capillary: 195 mg/dL — ABNORMAL HIGH (ref 70–99)

## 2021-12-09 LAB — LIPID PANEL
Cholesterol: 145 mg/dL (ref 0–200)
HDL: 35 mg/dL — ABNORMAL LOW (ref >40)
LDL Cholesterol: 79 mg/dL (ref 0–99)
Total CHOL/HDL Ratio: 4.1 RATIO
Triglycerides: 157 mg/dL — ABNORMAL HIGH (ref <150)
VLDL: 31 mg/dL (ref 0–40)

## 2021-12-09 LAB — OSMOLALITY, URINE: Osmolality, Ur: 853 mOsm/kg (ref 300–900)

## 2021-12-09 LAB — SODIUM, URINE, RANDOM: Sodium, Ur: 70 mmol/L

## 2021-12-09 MED ORDER — ATORVASTATIN CALCIUM 20 MG PO TABS: 20.0000 mg | ORAL_TABLET | Freq: Every day | ORAL | 1 refills | Status: DC

## 2021-12-09 MED ORDER — LISINOPRIL 20 MG PO TABS: 20.0000 mg | ORAL_TABLET | Freq: Every day | ORAL | 1 refills | Status: AC

## 2021-12-09 MED ORDER — BLOOD GLUCOSE MONITOR KIT: PACK | 1 refills | Status: AC

## 2021-12-09 MED ORDER — LIVING WELL WITH DIABETES BOOK: Freq: Once | Status: AC

## 2021-12-09 MED ORDER — GLIPIZIDE 5 MG PO TABS: 5.0000 mg | ORAL_TABLET | Freq: Every day | ORAL | 1 refills | Status: AC

## 2021-12-09 MED ORDER — CLOPIDOGREL BISULFATE 75 MG PO TABS: 75.0000 mg | ORAL_TABLET | Freq: Every day | ORAL | 0 refills | Status: DC

## 2021-12-09 MED ORDER — ASPIRIN 81 MG PO CHEW: 81.0000 mg | CHEWABLE_TABLET | Freq: Every day | ORAL | Status: DC

## 2021-12-09 MED ORDER — METFORMIN HCL 500 MG PO TABS: 500.0000 mg | ORAL_TABLET | Freq: Two times a day (BID) | ORAL | 1 refills | Status: AC

## 2021-12-09 NOTE — Hospital Course (Addendum)
56 y.o. male with medical history significant of HTN, untreated T2DM, HLD, tobacco abuse who presented to ED with complaints of acute onset LUE weakness.  Symptoms started on Tuesday 12/02/21. He states he woke up the morning of 12/02/21, and his left hand felt funny. It still had strength. He states as the days went on he noticed weakness in his left hand and upper arm.  He worked through Saturday night and states the weakness would wax and wane.   Sometimes it felt stronger and then sometimes it was so weak he wasn't able to do anything.  His girlfriend really encouraged him to come to ED. in the ED, the patient was afebrile and hemodynamically stable with oxygen saturation 100% on room air.  BMP showed sodium 136, potassium 3.6, bicarbonate 22, serum creatinine 0.84, serum glucose 248.  LFTs were essentially unremarkable.  WBC 9.0, hemoglobin 14.6, platelets 216,000.  CT of the brain was negative for acute findings.  MRI of the brain showed a small acute infarct in the right internal capsule.  MRI of the brain was negative for any LVO or proximal vessel occlusion.  MRI of the cervical spine showed moderate stenosis with mild to moderate left foraminal narrowing C3-4 and mild spinal stenosis C4-5.  There was abnormal signal at C5.  This is indeterminate.  The patient was started on aspirin and Plavix.

## 2021-12-09 NOTE — Progress Notes (Signed)
Patient has been stable this shift with no new complaints.

## 2021-12-09 NOTE — Progress Notes (Signed)
SLP Cancellation Note  Patient Details Name: Cameron [name] MRN: [id number] DOB: 10-31-1965   Cancelled treatment:       Reason Eval/Treat Not Completed: SLP screened, no needs identified, will sign off. Pt's cognition, speech and language are functioning at baseline. ST will sign off.   [name] H. Romie Levee, CCC-SLP Speech Language Pathologist    Georgetta Haber 12/09/2021, 11:02 AM

## 2021-12-09 NOTE — Progress Notes (Signed)
Ng Discharge Note  Admit Date:  12/08/2021 Discharge date: 12/09/2021   Cameron [name] to be D/C'd Home per MD order.  AVS completed. Patient/caregiver able to verbalize understanding.  Discharge Medication: Allergies as of 12/09/2021   No Known Allergies      Medication List     TAKE these medications    aspirin 81 MG chewable tablet Chew 1 tablet (81 mg total) by mouth daily. Start taking on: Dec 10, 2021   atorvastatin 20 MG tablet Commonly known as: LIPITOR Take 1 tablet (20 mg total) by mouth daily. Start taking on: Dec 10, 2021   blood glucose meter kit and supplies Kit Dispense based on patient and insurance preference. Use up to four times daily as directed.   clopidogrel 75 MG tablet Commonly known as: PLAVIX Take 1 tablet (75 mg total) by mouth daily. Start taking on: Dec 10, 2021   glipiZIDE 5 MG tablet Commonly known as: GLUCOTROL Take 1 tablet (5 mg total) by mouth daily before breakfast.   lisinopril 20 MG tablet Commonly known as: ZESTRIL Take 1 tablet (20 mg total) by mouth daily. Start taking on: Dec 10, 2021   metFORMIN 500 MG tablet Commonly known as: GLUCOPHAGE Take 1 tablet (500 mg total) by mouth 2 (two) times daily with a meal.        Discharge Assessment: Vitals:   12/09/21 0422 12/09/21 1229  BP: (!) 119/92 (!) 120/92  Pulse: 70 72  Resp: 16 16  Temp: 98 F (36.7 C) 98.7 F (37.1 C)  SpO2: 97% 98%   Skin clean, dry and intact without evidence of skin break down, no evidence of skin tears noted. IV catheter discontinued intact. Site without signs and symptoms of complications - no redness or edema noted at insertion site, patient denies c/o pain - only slight tenderness at site.  Dressing with slight pressure applied.  D/c Instructions-Education: Discharge instructions given to patient/family with verbalized understanding. D/c education completed with patient/family including follow up instructions, medication list, d/c  activities limitations if indicated, with other d/c instructions as indicated by MD - patient able to verbalize understanding, all questions fully answered. Patient instructed to return to ED, call 911, or call MD for any changes in condition.  Patient escorted via [place], and D/C home via private auto.  Tsosie Billing, LPN 9/70/2637 8:58 PM

## 2021-12-09 NOTE — Evaluation (Signed)
Occupational Therapy Evaluation Patient Details Name: Cameron [name] MRN: [id number] DOB: 05/31/1966 Today's Date: 12/09/2021   History of Present Illness Cameron [name] is a 56 y.o. male with medical history significant of HTN, untreated T2DM, HLD, tobacco abuse who presented to ED with complaints of acute onset LUE weakness.  Symptoms started on Tuesday. He states he woke up and his left hand felt funny. It still had strength. He states as the days went on he noticed weakness in his left hand and upper arm.  He worked through Saturday night and states the weakness would wax and wane.   Sometimes it felt stronger and then sometimes it was so weak he wasn't able to do anything.  His girlfriend really encouraged him to come to ED today. No complaints with lower leg weakness. No focal deficits, speech abnormality or confusion. (per MD)   Clinical Impression   Pt agreeable to OT and PT co-evaluation. Pt presents with good bed mobility, standing balance, and transfers. Primary limitation in L UE decreased gross and fine motor skills. Pt is able to compensate with ADL's and at work using R hand for majority of tasks. L UE is weak with more pronounced weakness in wrist and digits as noted by inability to even attempt sequential finger touching with L hand. Pt is recommended for outpatient OT services to improve L UE strength and coordination. Pt is not recommended for further acute OT services and will be discharged to care of nursing staff for remaining length of stay.       Recommendations for follow up therapy are one component of a multi-disciplinary discharge planning process, led by the attending physician.  Recommendations may be updated based on patient status, additional functional criteria and insurance authorization.   Follow Up Recommendations  Outpatient OT    Assistance Recommended at Discharge PRN        Functional Status Assessment  Patient has had a recent decline in their functional  status and demonstrates the ability to make significant improvements in function in a reasonable and predictable amount of time.  Equipment Recommendations  None recommended by OT           Precautions / Restrictions Precautions Precautions: None Restrictions Weight Bearing Restrictions: No      Mobility Bed Mobility Overal bed mobility: Independent                  Transfers Overall transfer level: Independent Equipment used: None               General transfer comment: able to ambulate in room and hall without issue      Balance Overall balance assessment: Independent                                         ADL either performed or assessed with clinical judgement   ADL Overall ADL's : Modified independent                                       General ADL Comments: Pt able to complete lower body dressing and compenstates for ADL's and IADL's by using R hand majority of task.     Vision Baseline Vision/History:  (trouble seeing items close up; does not have glasses) Ability to See in Adequate Light: 1  Impaired Patient Visual Report: No change from baseline Vision Assessment?: No apparent visual deficits (other than baseline)                Pertinent Vitals/Pain Pain Assessment Pain Assessment: Faces Pain Score: 0-No pain     Hand Dominance Right   Extremity/Trunk Assessment Upper Extremity Assessment Upper Extremity Assessment: LUE deficits/detail LUE Deficits / Details: 4+/5 shoulder flexion and abduction; 4+/5 elbow flexion; 4/5 elbow extension; wrist extension 2+/5; wrist flexion 2+/5; grip 3+/5. Decreased gross and fine motor coordination. LUE Sensation: WNL LUE Coordination: decreased fine motor;decreased gross motor (unable to complete sequential finger touching at all)   Lower Extremity Assessment Lower Extremity Assessment: Defer to PT evaluation   Cervical / Trunk Assessment Cervical / Trunk  Assessment: Normal   Communication Communication Communication: No difficulties   Cognition Arousal/Alertness: Awake/alert Behavior During Therapy: WFL for tasks assessed/performed Overall Cognitive Status: Within Functional Limits for tasks assessed                                                        Home Living Family/patient expects to be discharged to:: Private residence Living Arrangements: Non-relatives/Friends Available Help at Discharge: Available 24 hours/day Type of Home: House Home Access: Ramped entrance     Home Layout: One level     Bathroom Shower/Tub: Chief Strategy Officer: Handicapped height     Home Equipment: Shower seat   Additional Comments: Pt reports living with roommate and roommates mother.      Prior Functioning/Environment Prior Level of Function : Independent/Modified Independent             Mobility Comments: [employer] ambulator without AD; works and drives ADLs Comments: Indpendent ADL and IADL's        OT Problem List: Decreased range of motion;Decreased strength;Decreased coordination;Impaired UE functional use            OT Goals(Current goals can be found in the care plan section) Acute Rehab OT Goals Patient Stated Goal: return home  OT Frequency:      Co-evaluation PT/OT/SLP Co-Evaluation/Treatment: Yes Reason for Co-Treatment: To address functional/ADL transfers   OT goals addressed during session: ADL's and self-care      AM-PAC OT "6 Clicks" Daily Activity     Outcome Measure Help from another person eating meals?: None Help from another person taking care of personal grooming?: None Help from another person toileting, which includes using toliet, bedpan, or urinal?: None Help from another person bathing (including washing, rinsing, drying)?: None Help from another person to put on and taking off regular upper body clothing?: None Help from another person to put on and  taking off regular lower body clothing?: None 6 Click Score: 24   End of Session    Activity Tolerance: Patient tolerated treatment well Patient left: in bed;with call bell/phone within reach  OT Visit Diagnosis: Muscle weakness (generalized) (M62.81);Hemiplegia and hemiparesis;Other symptoms and signs involving the nervous system (R29.898) Hemiplegia - Right/Left: Left Hemiplegia - dominant/non-dominant: Non-Dominant Hemiplegia - caused by: Cerebral infarction (stenosis may also be contributing)                Time: 5462-7035 OT Time Calculation (min): 10 min Charges:  OT General Charges $OT Visit: 1 Visit OT Evaluation $OT Eval Low Complexity: 1 Low  Timothy Townsel  OT, MOT  Danie ChandlerSamuel  Kathleena Freeman 12/09/2021, 9:48 AM

## 2021-12-09 NOTE — Discharge Summary (Signed)
Physician Discharge Summary   Patient: Cameron [name] MRN: [id number] DOB: September 20, 1965  Admit date:     12/08/2021  Discharge date: 12/09/21  Discharge Physician: Shanon Brow Kharee Lesesne   PCP: Redmond School, MD   Recommendations at discharge:   Please follow up with primary care provider within 1-2 weeks  Please repeat BMP and CBC in one week    Hospital Course:  56 y.o. male with medical history significant of HTN, untreated T2DM, HLD, tobacco abuse who presented to ED with complaints of acute onset LUE weakness.  Symptoms started on Tuesday 12/02/21. He states he woke up the morning of 12/02/21, and his left hand felt funny. It still had strength. He states as the days went on he noticed weakness in his left hand and upper arm.  He worked through Saturday night and states the weakness would wax and wane.   Sometimes it felt stronger and then sometimes it was so weak he wasn't able to do anything.  His girlfriend really encouraged him to come to ED. in the ED, the patient was afebrile and hemodynamically stable with oxygen saturation 100% on room air.  BMP showed sodium 136, potassium 3.6, bicarbonate 22, serum creatinine 0.84, serum glucose 248.  LFTs were essentially unremarkable.  WBC 9.0, hemoglobin 14.6, platelets 216,000.  CT of the brain was negative for acute findings.  MRI of the brain showed a small acute infarct in the right internal capsule.  MRI of the brain was negative for any LVO or proximal vessel occlusion.  MRI of the cervical spine showed moderate stenosis with mild to moderate left foraminal narrowing C3-4 and mild spinal stenosis C4-5.  There was abnormal signal at C5.  This is indeterminate.  The patient was started on aspirin and Plavix.  Assessment and Plan: * Acute ischemic stroke [facility]) -Appreciate Neurology Consult -PT/OT evaluation>>outpt OT -Speech therapy eval -CT brain--negative -MRI brain--small acute infarct in right internal capsule -MRA brain--no LVO, no hemodynamically  significant stenosis -Carotid Duplex--no hemodynamically significant stenosis -Echo--EF 55 to 60%, no WMA, negative bubble study -LDL--104 -HbA1C--8.3 -Antiplatelet--ASA 81 mg & Plavix 75 mg x 21 days, then ASA alone  Cervical spine degeneration MR-Cspine--mild to moderate left foraminal narrowing C3-4 and mild spinal stenosis C4-5.  There was abnormal signal at C5.  This is indeterminate.  -Also has abnormal BM signal in the C5 vertebral body, indeterminate. F/u outpatient  Hyponatremia Likely secondary to beer potomania and corrects to 132 with hyperglycemia TSH 2.788 Serum osm 290 Advised to decrease alcohol intake.    HTN (hypertension) Untreated HTN, was on lisinopril in the past with no issues Hydralazine prn  TOC consult for PCP placement   Uncontrolled type 2 diabetes mellitus with hyperglycemia, without long-term current use of insulin (HCC) A1C 8.3, was on metformin in past -d/c home with metformin and glipizide Start SSI and accuchecks qac/hs TOC consult for PCP Diabetic education   Hyperlipidemia Untreated, LDL pending. Will put in for full lipid panel Goal LDL <70, will start therapy with lipitor   Alcohol use Drinks 6 pack/night, no hx of withdrawals Start CIWA protocol MV, thiamine and folic acid Encouraged him to slowly decrease this amount   Tobacco abuse Nicotine patch, encouraged cessation          Consultants: neuro Procedures performed: none  Disposition: Home Diet recommendation:  Carb modified diet DISCHARGE MEDICATION: Allergies as of 12/09/2021   No Known Allergies      Medication List     TAKE these medications  aspirin 81 MG chewable tablet Chew 1 tablet (81 mg total) by mouth daily. Start taking on: Dec 10, 2021   atorvastatin 20 MG tablet Commonly known as: LIPITOR Take 1 tablet (20 mg total) by mouth daily. Start taking on: Dec 10, 2021   blood glucose meter kit and supplies Kit Dispense based on patient and  insurance preference. Use up to four times daily as directed.   clopidogrel 75 MG tablet Commonly known as: PLAVIX Take 1 tablet (75 mg total) by mouth daily. Start taking on: Dec 10, 2021   glipiZIDE 5 MG tablet Commonly known as: GLUCOTROL Take 1 tablet (5 mg total) by mouth daily before breakfast.   lisinopril 20 MG tablet Commonly known as: ZESTRIL Take 1 tablet (20 mg total) by mouth daily. Start taking on: Dec 10, 2021   metFORMIN 500 MG tablet Commonly known as: GLUCOPHAGE Take 1 tablet (500 mg total) by mouth 2 (two) times daily with a meal.        Follow-up Information     Phillips Odor, MD. Schedule an appointment as soon as possible for a visit in 1 month(s).   Specialty: Neurology Contact information: [address] [place] [address] [phone]                Discharge Exam: Filed Weights   12/08/21 1047  Weight: 79.4 kg  HEENT:  [place]/AT, No thrush, no icterus CV:  RRR, no rub, no S3, no S4 Lung:  bibasilar crackles. No wheeze Abd:  soft/+BS, NT Ext:  No edema, no lymphangitis, no synovitis, no rash  Neuro:  CN II-XII intact, strength 4/5 in RUE, RLE, strength 4-/5 LUE, and 4/5 LLE; sensation intact bilateral; no dysmetria; babinski equivocal   Condition at discharge: stable  The results of significant diagnostics from this hospitalization (including imaging, microbiology, ancillary and laboratory) are listed below for reference.   Imaging Studies: MR ANGIO HEAD WO CONTRAST  Result Date: 12/08/2021 CLINICAL DATA:  Neuro deficit, acute, stroke suspected left arm weakness EXAM: MRI HEAD WITHOUT CONTRAST MRA HEAD WITHOUT CONTRAST TECHNIQUE: Multiplanar, multi-echo pulse sequences of the brain and surrounding structures were acquired without intravenous contrast. Angiographic images of the Circle of Willis were acquired using MRA technique without intravenous contrast. COMPARISON:  None Available. FINDINGS: MRI HEAD Brain: 1.6 cm focus of restricted  diffusion with involvement of the posterior limb of the internal capsule. Ventricles and sulci are normal in size and configuration. Patchy and confluent areas of T2 hyperintensity in the supratentorial and pontine white matter are nonspecific but may reflect mild to moderate chronic microvascular ischemic changes. There are additional chronic small vessel infarcts of the right basal ganglia and adjacent white matter. No intracranial mass or mass effect. No hydrocephalus or extra-axial collection. Vascular: Major vessel flow voids at the skull base are preserved. Skull and upper cervical spine: Normal marrow signal is preserved. Cervical spine degenerative changes. Sinuses/Orbits: Paranasal sinuses are aerated. Orbits are unremarkable. Other: Sella is unremarkable.  Mastoid air cells are clear. MRA HEAD Intracranial internal carotid arteries are patent with atherosclerotic irregularity. Middle and anterior cerebral arteries are patent. Intracranial vertebral arteries, basilar artery, posterior cerebral arteries are patent. Bilateral posterior communicating arteries are present. There is no significant stenosis or aneurysm. IMPRESSION: Small acute infarct with involvement of the posterior limb of the right internal capsule. Mild to moderate chronic microvascular ischemic changes. Additional chronic small vessel infarcts of the right basal ganglia and adjacent white matter. No proximal intracranial vessel occlusion or significant stenosis.  Electronically Signed   By: Macy Mis M.D.   On: 12/08/2021 13:01   MR BRAIN WO CONTRAST  Result Date: 12/08/2021 CLINICAL DATA:  Neuro deficit, acute, stroke suspected left arm weakness EXAM: MRI HEAD WITHOUT CONTRAST MRA HEAD WITHOUT CONTRAST TECHNIQUE: Multiplanar, multi-echo pulse sequences of the brain and surrounding structures were acquired without intravenous contrast. Angiographic images of the Circle of Willis were acquired using MRA technique without intravenous  contrast. COMPARISON:  None Available. FINDINGS: MRI HEAD Brain: 1.6 cm focus of restricted diffusion with involvement of the posterior limb of the internal capsule. Ventricles and sulci are normal in size and configuration. Patchy and confluent areas of T2 hyperintensity in the supratentorial and pontine white matter are nonspecific but may reflect mild to moderate chronic microvascular ischemic changes. There are additional chronic small vessel infarcts of the right basal ganglia and adjacent white matter. No intracranial mass or mass effect. No hydrocephalus or extra-axial collection. Vascular: Major vessel flow voids at the skull base are preserved. Skull and upper cervical spine: Normal marrow signal is preserved. Cervical spine degenerative changes. Sinuses/Orbits: Paranasal sinuses are aerated. Orbits are unremarkable. Other: Sella is unremarkable.  Mastoid air cells are clear. MRA HEAD Intracranial internal carotid arteries are patent with atherosclerotic irregularity. Middle and anterior cerebral arteries are patent. Intracranial vertebral arteries, basilar artery, posterior cerebral arteries are patent. Bilateral posterior communicating arteries are present. There is no significant stenosis or aneurysm. IMPRESSION: Small acute infarct with involvement of the posterior limb of the right internal capsule. Mild to moderate chronic microvascular ischemic changes. Additional chronic small vessel infarcts of the right basal ganglia and adjacent white matter. No proximal intracranial vessel occlusion or significant stenosis. Electronically Signed   By: Macy Mis M.D.   On: 12/08/2021 13:01   MR CERVICAL SPINE WO CONTRAST  Result Date: 12/08/2021 CLINICAL DATA:  Myelopathy, acute, cervical spine. Left arm weakness. EXAM: MRI CERVICAL SPINE WITHOUT CONTRAST TECHNIQUE: Multiplanar, multisequence MR imaging of the cervical spine was performed. No intravenous contrast was administered. COMPARISON:  None  Available. FINDINGS: Alignment: Straightening of the normal cervical lordosis. No listhesis. Vertebrae: No fracture. Abnormal T1 hypointensity and T2/STIR hyperintensity throughout the C5 vertebral body. Cord: Normal signal. Posterior Fossa, vertebral arteries, paraspinal tissues: Posterior fossa more fully evaluated on today's separate head MRI. Preserved vertebral artery flow voids. No paraspinal fluid collection. Disc levels: C2-3: Mild left uncovertebral spurring and moderate left facet arthrosis result in moderate left neural foraminal stenosis without spinal stenosis. C3-4: Disc bulging and uncovertebral spurring result in moderate spinal stenosis with mild ventral cord flattening and mild right and mild-to-moderate left neural foraminal stenosis. C4-5: Disc bulging, a small central disc protrusion, uncovertebral spurring, and severe left facet arthrosis result in mild spinal stenosis and moderate to severe left neural foraminal stenosis. C5-6: Mild left eccentric disc bulging and uncovertebral spurring without significant stenosis. C6-7: Mild disc bulging without significant stenosis. C7-T1: Mild left facet arthrosis without stenosis. IMPRESSION: 1. Moderate spinal stenosis and mild-to-moderate left neural foraminal stenosis at C3-4. 2. Mild spinal stenosis and moderate to severe left neural foraminal stenosis at C4-5. 3. Abnormal marrow signal in the C5 vertebral body, indeterminate with possibilities including both benign etiologies (such as an atypical hemangioma) and metastatic disease. If there is a history of malignancy, consider a nuclear medicine bone scan for further evaluation. Cervical spine CT may be helpful to evaluate for any bone destruction. Electronically Signed   By: Logan Bores M.D.   On: 12/08/2021 13:25  US Carotid Bilateral  Result Date: 12/08/2021 CLINICAL DATA:  Stroke.  History of hypertension and smoking. EXAM: BILATERAL CAROTID DUPLEX ULTRASOUND TECHNIQUE: Pearline Cables scale imaging,  color Doppler and duplex ultrasound were performed of bilateral carotid and vertebral arteries in the neck. COMPARISON:  None Available. FINDINGS: Criteria: Quantification of carotid stenosis is based on velocity parameters that correlate the residual internal carotid diameter with NASCET-based stenosis levels, using the diameter of the distal internal carotid lumen as the denominator for stenosis measurement. The following velocity measurements were obtained: RIGHT ICA: 91/29 cm/sec CCA: 29/79 cm/sec SYSTOLIC ICA/CCA RATIO:  1.2 ECA: 65 cm/sec LEFT ICA: 75/29 cm/sec CCA: 89/21 cm/sec SYSTOLIC ICA/CCA RATIO:  0.9 ECA: 63 cm/sec RIGHT CAROTID ARTERY: There is a minimal amount of hypoechoic plaque within the right carotid bulb (image 17), not resulting in elevated peak systolic velocities within the interrogated course of the right internal carotid artery to suggest a hemodynamically significant stenosis. RIGHT VERTEBRAL ARTERY:  Antegrade flow LEFT CAROTID ARTERY: There is a minimal amount of intimal thickening/atherosclerotic plaque involving the origin and proximal aspects of the left internal carotid artery (images 58 and 60), not resulting in elevated peak systolic velocities within the interrogated course of the left internal carotid artery to suggest a hemodynamically significant stenosis. LEFT VERTEBRAL ARTERY:  Antegrade flow IMPRESSION: Minimal amount of bilateral atherosclerotic plaque, left greater than right, not resulting in a hemodynamically significant stenosis within either internal carotid artery. Electronically Signed   By: Sandi Mariscal M.D.   On: 12/08/2021 15:30   DG Shoulder Left  Result Date: 12/08/2021 CLINICAL DATA:  Acute LEFT shoulder pain today. No known injury. Initial encounter. EXAM: LEFT SHOULDER - 2+ VIEW COMPARISON:  11/16/2017 FINDINGS: There is no evidence of fracture or dislocation. There is no evidence of arthropathy or other focal bone abnormality. Soft tissues are  unremarkable. IMPRESSION: Negative. Electronically Signed   By: Margarette Canada M.D.   On: 12/08/2021 11:18   ECHOCARDIOGRAM COMPLETE BUBBLE STUDY  Result Date: 12/08/2021    ECHOCARDIOGRAM REPORT   Patient Name:   Cameron [name] Date of Exam: 12/08/2021 Medical Rec #:  [id number]      Height:       70.0 in Accession #:    [id number]     Weight:       175.0 lb Date of Birth:  1965/07/31      BSA:          1.972 m Patient Age:    70 years       BP:           162/113 mmHg Patient Gender: M              HR:           76 bpm. Exam Location:  [facility] Procedure: 2D Echo, Cardiac Doppler, Color Doppler and Saline Contrast Bubble            Study Indications:   Stroke  History:       Patient has no prior history of Echocardiogram examinations.                Stroke.  Sonographer:   Wenda Low Referring      Elmo Phys: IMPRESSIONS  1. Left ventricular ejection fraction, by estimation, is 55 to 60%. The left ventricle has normal function. The left ventricle has no regional wall motion abnormalities. There is mild left ventricular hypertrophy. Left ventricular diastolic parameters were normal.  2. Right  ventricular systolic function is normal. The right ventricular size is mildly enlarged. Tricuspid regurgitation signal is inadequate for assessing PA pressure.  3. The mitral valve is normal in structure. Trivial mitral valve regurgitation. No evidence of mitral stenosis.  4. The aortic valve is tricuspid. Aortic valve regurgitation is not visualized. No aortic stenosis is present.  5. The inferior vena cava is normal in size with greater than 50% respiratory variability, suggesting right atrial pressure of 3 mmHg.  6. Agitated saline contrast bubble study was negative, with no evidence of any interatrial shunt. FINDINGS  Left Ventricle: Left ventricular ejection fraction, by estimation, is 55 to 60%. The left ventricle has normal function. The left ventricle has no regional wall motion abnormalities.  The left ventricular internal cavity size was normal in size. There is  mild left ventricular hypertrophy. Left ventricular diastolic parameters were normal. Right Ventricle: The right ventricular size is mildly enlarged. No increase in right ventricular wall thickness. Right ventricular systolic function is normal. Tricuspid regurgitation signal is inadequate for assessing PA pressure. Left Atrium: Left atrial size was normal in size. Right Atrium: Right atrial size was normal in size. Pericardium: There is no evidence of pericardial effusion. Mitral Valve: The mitral valve is normal in structure. Trivial mitral valve regurgitation. No evidence of mitral valve stenosis. MV peak gradient, 3.2 mmHg. The mean mitral valve gradient is 1.0 mmHg. Tricuspid Valve: The tricuspid valve is normal in structure. Tricuspid valve regurgitation is not demonstrated. Aortic Valve: The aortic valve is tricuspid. Aortic valve regurgitation is not visualized. No aortic stenosis is present. Aortic valve mean gradient measures 4.0 mmHg. Aortic valve peak gradient measures 7.3 mmHg. Aortic valve area, by VTI measures 2.56 cm. Pulmonic Valve: The pulmonic valve was grossly normal. Pulmonic valve regurgitation is not visualized. Aorta: The aortic root is normal in size and structure. Venous: The inferior vena cava is normal in size with greater than 50% respiratory variability, suggesting right atrial pressure of 3 mmHg. IAS/Shunts: The interatrial septum was not well visualized. Agitated saline contrast was given intravenously to evaluate for intracardiac shunting. Agitated saline contrast bubble study was negative, with no evidence of any interatrial shunt.  LEFT VENTRICLE PLAX 2D LVIDd:         4.70 cm   Diastology LVIDs:         3.55 cm   LV e' medial:    7.07 cm/s LV PW:         1.20 cm   LV E/e' medial:  12.2 LV IVS:        1.20 cm   LV e' lateral:   9.57 cm/s LVOT diam:     2.00 cm   LV E/e' lateral: 9.0 LV SV:         71 LV SV  Index:   36 LVOT Area:     3.14 cm  RIGHT VENTRICLE RV Basal diam:  3.75 cm RV Mid diam:    2.90 cm RV S prime:     12.80 cm/s TAPSE (M-mode): 2.1 cm LEFT ATRIUM             Index        RIGHT ATRIUM           Index LA diam:        3.30 cm 1.67 cm/m   RA Area:     16.80 cm LA Vol (A2C):   54.3 ml 27.53 ml/m  RA Volume:   49.30 ml  25.00 ml/m LA Vol (  A4C):   42.0 ml 21.30 ml/m LA Biplane Vol: 47.5 ml 24.09 ml/m  AORTIC VALVE                    PULMONIC VALVE AV Area (Vmax):    2.65 cm     PV Vmax:       0.89 m/s AV Area (Vmean):   2.44 cm     PV Peak grad:  3.2 mmHg AV Area (VTI):     2.56 cm AV Vmax:           135.00 cm/s AV Vmean:          82.700 cm/s AV VTI:            0.279 m AV Peak Grad:      7.3 mmHg AV Mean Grad:      4.0 mmHg LVOT Vmax:         114.00 cm/s LVOT Vmean:        64.100 cm/s LVOT VTI:          0.227 m LVOT/AV VTI ratio: 0.81  AORTA Ao Root diam: 3.20 cm MITRAL VALVE MV Area (PHT): 3.68 cm    SHUNTS MV Area VTI:   2.08 cm    Systemic VTI:  0.23 m MV Peak grad:  3.2 mmHg    Systemic Diam: 2.00 cm MV Mean grad:  1.0 mmHg MV Vmax:       0.90 m/s MV Vmean:      52.6 cm/s MV Decel Time: 206 msec MV E velocity: 86.50 cm/s MV A velocity: 72.00 cm/s MV E/A ratio:  1.20 Oswaldo Milian MD Electronically signed by Oswaldo Milian MD Signature Date/Time: 12/08/2021/4:52:59 PM    Final     Microbiology: Results for orders placed or performed in visit on 03/07/20  Novel Coronavirus, NAA ([facility])     Status: Abnormal   Collection Time: 03/07/20 11:54 AM   Specimen: Nasopharyngeal(NP) swabs in vial transport medium   Nasopharynge  Screenin  Result Value Ref Range Status   SARS-CoV-2, NAA Detected (A) Not Detected Final    Comment: Patients who have a positive COVID-19 test result may now have treatment options. Treatment options are available for patients with mild to moderate symptoms and for hospitalized patients. Visit our website at [email or web address]  for resources and information. This nucleic acid amplification test was developed and its performance characteristics determined by [employer]. Nucleic acid amplification tests include RT-PCR and TMA. This test has not been FDA cleared or approved. This test has been authorized by FDA under an Emergency Use Authorization (EUA). This test is only authorized for the duration of time the declaration that circumstances exist justifying the authorization of the emergency use of in vitro diagnostic tests for detection of SARS-CoV-2 virus and/or diagnosis of COVID-19 infection under section 564(b)(1) of the Act, 21 U.S.C. 114YWV-1(U) (1), unless the authorization is terminated or revoked sooner. When diagnostic testing is negativ e, the possibility of a false negative result should be considered in the context of a patient's recent exposures and the presence of clinical signs and symptoms consistent with COVID-19. An individual without symptoms of COVID-19 and who is not shedding SARS-CoV-2 virus would expect to have a negative (not detected) result in this assay.   SARS-COV-2, NAA 2 DAY Kacey Dysert     Status: None   Collection Time: 03/07/20 11:54 AM   Nasopharynge  Screenin  Result Value Ref Range Status   SARS-CoV-2, NAA 2 DAY Cataleia Gade Performed  Final    Labs: CBC: Recent Labs  Lab 12/08/21 1144  WBC 9.0  HGB 14.6  HCT 44.2  MCV 86.0  PLT 096   Basic Metabolic Panel: Recent Labs  Lab 12/08/21 1144 12/09/21 0503  NA 130* 135  K 3.6 3.7  CL 101 103  CO2 22 24  GLUCOSE 248* 175*  BUN 11 10  CREATININE 0.84 0.86  CALCIUM 9.1 8.9   Liver Function Tests: Recent Labs  Lab 12/08/21 1144  AST 43*  ALT 39  ALKPHOS 67  BILITOT 0.7  PROT 8.6*  ALBUMIN 4.1   CBG: Recent Labs  Lab 12/08/21 1722 12/08/21 2050 12/09/21 0718 12/09/21 1124  GLUCAP 176* 169* 225* 195*    Discharge time spent: greater than 30 minutes.  Signed: Orson Eva, MD [facility] 12/09/2021

## 2021-12-09 NOTE — Evaluation (Signed)
Physical Therapy Evaluation Patient Details Name: Cameron [name] MRN: [id number] DOB: 29-Sep-1965 Today's Date: 12/09/2021  History of Present Illness  Cameron [name] is a 56 y.o. male with medical history significant of HTN, untreated T2DM, HLD, tobacco abuse who presented to ED with complaints of acute onset LUE weakness.  Symptoms started on Tuesday. He states he woke up and his left hand felt funny. It still had strength. He states as the days went on he noticed weakness in his left hand and upper arm.  He worked through Saturday night and states the weakness would wax and wane.   Sometimes it felt stronger and then sometimes it was so weak he wasn't able to do anything.  His girlfriend really encouraged him to come to ED today. No complaints with lower leg weakness. No focal deficits, speech abnormality or confusion.   Clinical Impression  Patient functioning near baseline for functional mobility and gait other than LUE weakness limiting use for functional tasks with LUE, otherwise patient demonstrates good return for bed mobility, transfers and ambulation on level, inclined and declined surfaces without loss of balance or need for AD.  Plan:  Patient discharged from physical therapy to care of nursing for ambulation daily as tolerated for length of stay.         Recommendations for follow up therapy are one component of a multi-disciplinary discharge planning process, led by the attending physician.  Recommendations may be updated based on patient status, additional functional criteria and insurance authorization.  Follow Up Recommendations No PT follow up    Assistance Recommended at Discharge PRN  Patient can return home with the following  Other (comment) (near baseline other than LUE weakness)    Equipment Recommendations None recommended by PT  Recommendations for Other Services       Functional Status Assessment Patient has had a recent decline in their functional status and  demonstrates the ability to make significant improvements in function in a reasonable and predictable amount of time. (normal other than LUE weakness limiting use for functional tasks)     Precautions / Restrictions Precautions Precautions: None Restrictions Weight Bearing Restrictions: No      Mobility  Bed Mobility Overal bed mobility: Independent                  Transfers Overall transfer level: Independent Equipment used: None               General transfer comment: as per OT notes    Ambulation/Gait Ambulation/Gait assistance: Independent, Modified independent (Device/Increase time) Gait Distance (Feet): 200 Feet Assistive device: None Gait Pattern/deviations: WFL(Within Functional Limits) Gait velocity: normal     General Gait Details: grossly WFL except limited LUE arm swing due to weakness with good return for ambulation on level, inclined and declined surfaces without loss of balance  Stairs            Wheelchair Mobility    Modified Rankin (Stroke Patients Only)       Balance Overall balance assessment: Independent                                           Pertinent Vitals/Pain Pain Assessment Pain Assessment: No/denies pain    Home Living Family/patient expects to be discharged to:: Private residence Living Arrangements: Non-relatives/Friends Available Help at Discharge: Available 24 hours/day Type of Home: House Home Access:  Ramped entrance       Home Layout: One level Home Equipment: Shower seat Additional Comments: Pt reports living with roommate and roommates mother.    Prior Function Prior Level of Function : Independent/Modified Independent             Mobility Comments: [employer] ambulator without AD; works and drives ADLs Comments: Indpendent ADL and IADL's     Hand Dominance   Dominant Hand: Right    Extremity/Trunk Assessment   Upper Extremity Assessment Upper Extremity  Assessment: Defer to OT evaluation LUE Deficits / Details: 4+/5 shoulder flexion and abduction; 4+/5 elbow flexion; 4/5 elbow extension; wrist extension 2+/5; wrist flexion 2+/5; grip 3+/5. Decreased gross and fine motor coordination. LUE Sensation: WNL LUE Coordination: decreased fine motor;decreased gross motor (unable to complete sequential finger touching at all)    Lower Extremity Assessment Lower Extremity Assessment: Overall WFL for tasks assessed    Cervical / Trunk Assessment Cervical / Trunk Assessment: Normal  Communication   Communication: No difficulties  Cognition Arousal/Alertness: Awake/alert Behavior During Therapy: WFL for tasks assessed/performed Overall Cognitive Status: Within Functional Limits for tasks assessed                                          General Comments      Exercises     Assessment/Plan    PT Assessment Patient does not need any further PT services  PT Problem List         PT Treatment Interventions      PT Goals (Current goals can be found in the Care Plan section)  Acute Rehab PT Goals Patient Stated Goal: return home with family to assist PT Goal Formulation: With patient Time For Goal Achievement: 12/09/21 Potential to Achieve Goals: Good    Frequency       Co-evaluation PT/OT/SLP Co-Evaluation/Treatment: Yes Reason for Co-Treatment: To address functional/ADL transfers PT goals addressed during session: Mobility/safety with mobility;Balance OT goals addressed during session: ADL's and self-care       AM-PAC PT "6 Clicks" Mobility  Outcome Measure Help needed turning from your back to your side while in a flat bed without using bedrails?: None Help needed moving from lying on your back to sitting on the side of a flat bed without using bedrails?: None Help needed moving to and from a bed to a chair (including a wheelchair)?: None Help needed standing up from a chair using your arms (e.g., wheelchair  or bedside chair)?: None Help needed to walk in hospital room?: None Help needed climbing 3-5 steps with a railing? : None 6 Click Score: 24    End of Session   Activity Tolerance: Patient tolerated treatment well Patient left: in bed;with call bell/phone within reach Nurse Communication: Mobility status PT Visit Diagnosis: Unsteadiness on feet (R26.81);Other abnormalities of gait and mobility (R26.89);Muscle weakness (generalized) (M62.81)    Time: 7353-2992 PT Time Calculation (min) (ACUTE ONLY): 13 min   Charges:   PT Evaluation $PT Eval Low Complexity: 1 Low PT Treatments $Therapeutic Activity: 8-22 mins        10:25 AM, 12/09/21 Ocie Bob, MPT Physical Therapist with [facility] 336 [phone] office [phone] mobile phone

## 2021-12-09 NOTE — Consult Note (Signed)
[employer], operational Provider: Dr. Carles Collet  Chief Complaint: left UE weakness  HPI:  56 year old male with history of hypertension, hyperlipidemia, diabetes, smoker and alcohol abuse admitted for left upper extremity weakness for a week.  He stated that a week ago he woke up with left arm weakness.  Initially more left hand weakness, then progressively getting weaker on the left hand and also spreading to left arm.  He felt stronger in the morning and then weaker at night.  Family also reported slurred speech initially but then resolved.  Family eventually persuaded him to come into ER for evaluation.  Patient denies any left leg weakness or facial droop.  He admitted that he smokes 1 pack/day and drinks 6 packs/day.  Denies drug use.  He denies any history of stroke in the past.  LKW: 1 week ago tpa given?: No, outside window IR Thrombectomy? No, no LVO sign Modified Rankin Scale: 0-Completely asymptomatic and back to baseline post- stroke   Exam: Vitals:   12/08/21 2050 12/09/21 0422  BP: 127/87 (!) 119/92  Pulse: 73 70  Resp: 18 16  Temp: 98.3 F (36.8 C) 98 F (36.7 C)  SpO2: 99% 97%     Temp:  [98 F (36.7 C)-98.3 F (36.8 C)] 98 F (36.7 C) (05/23 0422) Pulse Rate:  [70-75] 70 (05/23 0422) Resp:  [16-18] 16 (05/23 0422) BP: (119-183)/(87-113) 119/92 (05/23 0422) SpO2:  [97 %-100 %] 97 % (05/23 0422)  General - Well nourished, well developed, in no apparent distress.  Ophthalmologic - fundi not visualized due to noncooperation.  Cardiovascular - Regular rhythm and rate.  Neuro - awake, alert, eyes open, orientated to age, place, time and people. No aphasia, fluent language, following all simple commands. Able to name and repeat and read. No gaze palsy, tracking bilaterally, visual field full, PERRL. No facial droop. Tongue midline. RUE and RLE 5/5, LUE 4/5 proximal, 3-/5 finger movement. LLE proximal 5/5, distal ankle DF 4=/5.  Sensation symmetrical bilaterally, b/l FTN intact, gait not tested.  Marland Kitchen   NIH Stroke Scale  Level Of Consciousness 0=Alert; keenly responsive 1=Arouse to minor stimulation 2=Requires repeated stimulation to arouse or movements to pain 3=postures or unresponsive 0  LOC Questions to Month and Age 74=Answers both questions correctly 1=Answers one question correctly or dysarthria/intubated/trauma/language barrier 2=Answers neither question correctly or aphasia 0  LOC Commands      -Open/Close eyes     -Open/close grip     -Pantomime commands if communication barrier 0=Performs both tasks correctly 1=Performs one task correctly 2=Performs neighter task correctly 0  Best Gaze     -Only assess horizontal gaze 0=Normal 1=Partial gaze palsy 2=Forced deviation, or total gaze paresis 0  Visual 0=No visual loss 1=Partial hemianopia 2=Complete hemianopia 3=Bilateral hemianopia (blind including cortical blindness) 0  Facial Palsy     -Use grimace if obtunded 0=Normal symmetrical movement 1=Minor paralysis (asymmetry) 2=Partial paralysis (lower face) 3=Complete paralysis (upper and lower face) 0  Motor  0=No drift for 10/5 seconds 1=Drift, but does not hit bed 2=Some antigravity effort, hits  bed 3=No effort against gravity, limb falls 4=No movement 0=Amputation/joint fusion Right Arm 0     Leg 0    Left Arm 1     Leg 0  Limb Ataxia     - FNT/HTS 0=Absent or does not understand or paralyzed or amputation/joint fusion 1=Present in one limb 2=Present in two limbs 0  Sensory 0=Normal 1=Mild to moderate sensory loss 2=Severe to total  sensory loss or coma/unresponsive 0  Best Language 0=No aphasia, normal 1=Mild to moderate aphasia 2=Severe aphasia 3=Mute, global aphasia, or coma/unresponsive 0  Dysarthria 0=Normal 1=Mild to moderate 2=Severe, unintelligible or mute/anarthric 0=intubated/unable to test 0  Extinction/Neglect 0=No  abnormality 1=visual/tactile/auditory/spatia/personal inattention/Extinction to bilateral simultaneous stimulation 2=Profound neglect/extinction more than 1 modality  0  Total   1      Imaging Reviewed:  MR ANGIO HEAD WO CONTRAST  Result Date: 12/08/2021 CLINICAL DATA:  Neuro deficit, acute, stroke suspected left arm weakness EXAM: MRI HEAD WITHOUT CONTRAST MRA HEAD WITHOUT CONTRAST TECHNIQUE: Multiplanar, multi-echo pulse sequences of the brain and surrounding structures were acquired without intravenous contrast. Angiographic images of the Circle of Willis were acquired using MRA technique without intravenous contrast. COMPARISON:  None Available. FINDINGS: MRI HEAD Brain: 1.6 cm focus of restricted diffusion with involvement of the posterior limb of the internal capsule. Ventricles and sulci are normal in size and configuration. Patchy and confluent areas of T2 hyperintensity in the supratentorial and pontine white matter are nonspecific but may reflect mild to moderate chronic microvascular ischemic changes. There are additional chronic small vessel infarcts of the right basal ganglia and adjacent white matter. No intracranial mass or mass effect. No hydrocephalus or extra-axial collection. Vascular: Major vessel flow voids at the skull base are preserved. Skull and upper cervical spine: Normal marrow signal is preserved. Cervical spine degenerative changes. Sinuses/Orbits: Paranasal sinuses are aerated. Orbits are unremarkable. Other: Sella is unremarkable.  Mastoid air cells are clear. MRA HEAD Intracranial internal carotid arteries are patent with atherosclerotic irregularity. Middle and anterior cerebral arteries are patent. Intracranial vertebral arteries, basilar artery, posterior cerebral arteries are patent. Bilateral posterior communicating arteries are present. There is no significant stenosis or aneurysm. IMPRESSION: Small acute infarct with involvement of the posterior limb of the right  internal capsule. Mild to moderate chronic microvascular ischemic changes. Additional chronic small vessel infarcts of the right basal ganglia and adjacent white matter. No proximal intracranial vessel occlusion or significant stenosis. Electronically Signed   By: Macy Mis M.D.   On: 12/08/2021 13:01   MR BRAIN WO CONTRAST  Result Date: 12/08/2021 CLINICAL DATA:  Neuro deficit, acute, stroke suspected left arm weakness EXAM: MRI HEAD WITHOUT CONTRAST MRA HEAD WITHOUT CONTRAST TECHNIQUE: Multiplanar, multi-echo pulse sequences of the brain and surrounding structures were acquired without intravenous contrast. Angiographic images of the Circle of Willis were acquired using MRA technique without intravenous contrast. COMPARISON:  None Available. FINDINGS: MRI HEAD Brain: 1.6 cm focus of restricted diffusion with involvement of the posterior limb of the internal capsule. Ventricles and sulci are normal in size and configuration. Patchy and confluent areas of T2 hyperintensity in the supratentorial and pontine white matter are nonspecific but may reflect mild to moderate chronic microvascular ischemic changes. There are additional chronic small vessel infarcts of the right basal ganglia and adjacent white matter. No intracranial mass or mass effect. No hydrocephalus or extra-axial collection. Vascular: Major vessel flow voids at the skull base are preserved. Skull and upper cervical spine: Normal marrow signal is preserved. Cervical spine degenerative changes. Sinuses/Orbits: Paranasal sinuses are aerated. Orbits are unremarkable. Other: Sella is unremarkable.  Mastoid air cells are clear. MRA HEAD Intracranial internal carotid arteries are patent with atherosclerotic irregularity. Middle and anterior cerebral arteries are patent. Intracranial vertebral arteries, basilar artery, posterior cerebral arteries are patent. Bilateral posterior communicating arteries are present. There is no significant stenosis or  aneurysm. IMPRESSION: Small acute infarct with involvement of the posterior limb  of the right internal capsule. Mild to moderate chronic microvascular ischemic changes. Additional chronic small vessel infarcts of the right basal ganglia and adjacent white matter. No proximal intracranial vessel occlusion or significant stenosis. Electronically Signed   By: Macy Mis M.D.   On: 12/08/2021 13:01   MR CERVICAL SPINE WO CONTRAST  Result Date: 12/08/2021 CLINICAL DATA:  Myelopathy, acute, cervical spine. Left arm weakness. EXAM: MRI CERVICAL SPINE WITHOUT CONTRAST TECHNIQUE: Multiplanar, multisequence MR imaging of the cervical spine was performed. No intravenous contrast was administered. COMPARISON:  None Available. FINDINGS: Alignment: Straightening of the normal cervical lordosis. No listhesis. Vertebrae: No fracture. Abnormal T1 hypointensity and T2/STIR hyperintensity throughout the C5 vertebral body. Cord: Normal signal. Posterior Fossa, vertebral arteries, paraspinal tissues: Posterior fossa more fully evaluated on today's separate head MRI. Preserved vertebral artery flow voids. No paraspinal fluid collection. Disc levels: C2-3: Mild left uncovertebral spurring and moderate left facet arthrosis result in moderate left neural foraminal stenosis without spinal stenosis. C3-4: Disc bulging and uncovertebral spurring result in moderate spinal stenosis with mild ventral cord flattening and mild right and mild-to-moderate left neural foraminal stenosis. C4-5: Disc bulging, a small central disc protrusion, uncovertebral spurring, and severe left facet arthrosis result in mild spinal stenosis and moderate to severe left neural foraminal stenosis. C5-6: Mild left eccentric disc bulging and uncovertebral spurring without significant stenosis. C6-7: Mild disc bulging without significant stenosis. C7-T1: Mild left facet arthrosis without stenosis. IMPRESSION: 1. Moderate spinal stenosis and mild-to-moderate left  neural foraminal stenosis at C3-4. 2. Mild spinal stenosis and moderate to severe left neural foraminal stenosis at C4-5. 3. Abnormal marrow signal in the C5 vertebral body, indeterminate with possibilities including both benign etiologies (such as an atypical hemangioma) and metastatic disease. If there is a history of malignancy, consider a nuclear medicine bone scan for further evaluation. Cervical spine CT may be helpful to evaluate for any bone destruction. Electronically Signed   By: Logan Bores M.D.   On: 12/08/2021 13:25   US Carotid Bilateral  Result Date: 12/08/2021 CLINICAL DATA:  Stroke.  History of hypertension and smoking. EXAM: BILATERAL CAROTID DUPLEX ULTRASOUND TECHNIQUE: Pearline Cables scale imaging, color Doppler and duplex ultrasound were performed of bilateral carotid and vertebral arteries in the neck. COMPARISON:  None Available. FINDINGS: Criteria: Quantification of carotid stenosis is based on velocity parameters that correlate the residual internal carotid diameter with NASCET-based stenosis levels, using the diameter of the distal internal carotid lumen as the denominator for stenosis measurement. The following velocity measurements were obtained: RIGHT ICA: 91/29 cm/sec CCA: [id number] cm/sec SYSTOLIC ICA/CCA RATIO:  1.2 ECA: 65 cm/sec LEFT ICA: 75/29 cm/sec CCA: [id number] cm/sec SYSTOLIC ICA/CCA RATIO:  0.9 ECA: 63 cm/sec RIGHT CAROTID ARTERY: There is a minimal amount of hypoechoic plaque within the right carotid bulb (image 17), not resulting in elevated peak systolic velocities within the interrogated course of the right internal carotid artery to suggest a hemodynamically significant stenosis. RIGHT VERTEBRAL ARTERY:  Antegrade flow LEFT CAROTID ARTERY: There is a minimal amount of intimal thickening/atherosclerotic plaque involving the origin and proximal aspects of the left internal carotid artery (images 58 and 60), not resulting in elevated peak systolic velocities within the interrogated  course of the left internal carotid artery to suggest a hemodynamically significant stenosis. LEFT VERTEBRAL ARTERY:  Antegrade flow IMPRESSION: Minimal amount of bilateral atherosclerotic plaque, left greater than right, not resulting in a hemodynamically significant stenosis within either internal carotid artery. Electronically Signed   By: Eldridge Abrahams.D.  On: 12/08/2021 15:30   ECHOCARDIOGRAM COMPLETE BUBBLE STUDY  Result Date: 12/08/2021    ECHOCARDIOGRAM REPORT   Patient Name:   GILLIS HOLTHUSEN Date of Exam: 12/08/2021 Medical Rec #:  [id number]      Height:       70.0 in Accession #:    [id number]     Weight:       175.0 lb Date of Birth:  05/08/1966      BSA:          1.972 m Patient Age:    61 years       BP:           162/113 mmHg Patient Gender: M              HR:           76 bpm. Exam Location:  [facility] Procedure: 2D Echo, Cardiac Doppler, Color Doppler and Saline Contrast Bubble            Study Indications:   Stroke  History:       Patient has no prior history of Echocardiogram examinations.                Stroke.  Sonographer:   Wenda Low Referring      [place] Phys: IMPRESSIONS  1. Left ventricular ejection fraction, by estimation, is 55 to 60%. The left ventricle has normal function. The left ventricle has no regional wall motion abnormalities. There is mild left ventricular hypertrophy. Left ventricular diastolic parameters were normal.  2. Right ventricular systolic function is normal. The right ventricular size is mildly enlarged. Tricuspid regurgitation signal is inadequate for assessing PA pressure.  3. The mitral valve is normal in structure. Trivial mitral valve regurgitation. No evidence of mitral stenosis.  4. The aortic valve is tricuspid. Aortic valve regurgitation is not visualized. No aortic stenosis is present.  5. The inferior vena cava is normal in size with greater than 50% respiratory variability, suggesting right atrial pressure of 3 mmHg.  6. Agitated  saline contrast bubble study was negative, with no evidence of any interatrial shunt. FINDINGS  Left Ventricle: Left ventricular ejection fraction, by estimation, is 55 to 60%. The left ventricle has normal function. The left ventricle has no regional wall motion abnormalities. The left ventricular internal cavity size was normal in size. There is  mild left ventricular hypertrophy. Left ventricular diastolic parameters were normal. Right Ventricle: The right ventricular size is mildly enlarged. No increase in right ventricular wall thickness. Right ventricular systolic function is normal. Tricuspid regurgitation signal is inadequate for assessing PA pressure. Left Atrium: Left atrial size was normal in size. Right Atrium: Right atrial size was normal in size. Pericardium: There is no evidence of pericardial effusion. Mitral Valve: The mitral valve is normal in structure. Trivial mitral valve regurgitation. No evidence of mitral valve stenosis. MV peak gradient, 3.2 mmHg. The mean mitral valve gradient is 1.0 mmHg. Tricuspid Valve: The tricuspid valve is normal in structure. Tricuspid valve regurgitation is not demonstrated. Aortic Valve: The aortic valve is tricuspid. Aortic valve regurgitation is not visualized. No aortic stenosis is present. Aortic valve mean gradient measures 4.0 mmHg. Aortic valve peak gradient measures 7.3 mmHg. Aortic valve area, by VTI measures 2.56 cm. Pulmonic Valve: The pulmonic valve was grossly normal. Pulmonic valve regurgitation is not visualized. Aorta: The aortic root is normal in size and structure. Venous: The inferior vena cava is normal in size with greater than 50%  respiratory variability, suggesting right atrial pressure of 3 mmHg. IAS/Shunts: The interatrial septum was not well visualized. Agitated saline contrast was given intravenously to evaluate for intracardiac shunting. Agitated saline contrast bubble study was negative, with no evidence of any interatrial shunt.  LEFT  VENTRICLE PLAX 2D LVIDd:         4.70 cm   Diastology LVIDs:         3.55 cm   LV e' medial:    7.07 cm/s LV PW:         1.20 cm   LV E/e' medial:  12.2 LV IVS:        1.20 cm   LV e' lateral:   9.57 cm/s LVOT diam:     2.00 cm   LV E/e' lateral: 9.0 LV SV:         71 LV SV Index:   36 LVOT Area:     3.14 cm  RIGHT VENTRICLE RV Basal diam:  3.75 cm RV Mid diam:    2.90 cm RV S prime:     12.80 cm/s TAPSE (M-mode): 2.1 cm LEFT ATRIUM             Index        RIGHT ATRIUM           Index LA diam:        3.30 cm 1.67 cm/m   RA Area:     16.80 cm LA Vol (A2C):   54.3 ml 27.53 ml/m  RA Volume:   49.30 ml  25.00 ml/m LA Vol (A4C):   42.0 ml 21.30 ml/m LA Biplane Vol: 47.5 ml 24.09 ml/m  AORTIC VALVE                    PULMONIC VALVE AV Area (Vmax):    2.65 cm     PV Vmax:       0.89 m/s AV Area (Vmean):   2.44 cm     PV Peak grad:  3.2 mmHg AV Area (VTI):     2.56 cm AV Vmax:           135.00 cm/s AV Vmean:          82.700 cm/s AV VTI:            0.279 m AV Peak Grad:      7.3 mmHg AV Mean Grad:      4.0 mmHg LVOT Vmax:         114.00 cm/s LVOT Vmean:        64.100 cm/s LVOT VTI:          0.227 m LVOT/AV VTI ratio: 0.81  AORTA Ao Root diam: 3.20 cm MITRAL VALVE MV Area (PHT): 3.68 cm    SHUNTS MV Area VTI:   2.08 cm    Systemic VTI:  0.23 m MV Peak grad:  3.2 mmHg    Systemic Diam: 2.00 cm MV Mean grad:  1.0 mmHg MV Vmax:       0.90 m/s MV Vmean:      52.6 cm/s MV Decel Time: 206 msec MV E velocity: 86.50 cm/s MV A velocity: 72.00 cm/s MV E/A ratio:  1.20 Oswaldo Milian MD Electronically signed by Oswaldo Milian MD Signature Date/Time: 12/08/2021/4:52:59 PM    Final      Labs reviewed in epic and pertinent values follow: LDL 79, A1c 8.3, creatinine 0.86, UDS pending  Assessment:  56 year old male with history of hypertension, hyperlipidemia, diabetes, smoker and  alcohol abuse admitted for left upper extremity weakness for a week. On exam, still has left UE weakness, distal > proximal, and  left lower extremity mild distal weakness.  NIH score 1.  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  MRA unremarkable.  Carotid Doppler unremarkable.  EF 55 to 60%.  LDL 79, A1c 8.3, UDS pending.  Etiology for patient stroke likely small vessel disease given location and uncontrolled risk factors (hypertension, hyperlipidemia, uncontrolled diabetes, smoker, alcohol abuse).  Smoking cessation and limited alcohol use education provided.  Aggressive risk factor modification.  Recommend DAPT for 3 weeks and then aspirin alone.  Continue statin.  PT/OT recommend outpatient.   Recommendations:  Recommend DAPT for 3 weeks and then aspirin alone.   Continue Lipitor 20, no high intensity statin needed given LDL near goal. Smoking cessation and limited alcohol use education provided.   Aggressive risk factor modification.   Continue outpatient PT/OT Follow-up with Dr. Merlene Laughter as outpatient in 4 weeks.   Consult Participants: RN, patient, me, patient sister and girlfriend Location of the provider: home Location of the patient: [facility]  This consult was provided via telemedicine with 2-way video and audio communication. The patient/family was informed that care would be provided in this way and agreed to receive care in this manner.   This patient is receiving care for possible acute neurological changes. There was 50 minutes of care by this provider at the time of service, including time for direct evaluation via telemedicine, review of medical records, imaging studies and discussion of findings with providers, the patient and/or family.  Rosalin Hawking, MD PhD Stroke Neurology 12/09/2021 11:21 AM

## 2021-12-09 NOTE — Progress Notes (Addendum)
Inpatient Diabetes Program Recommendations  AACE/ADA: New Consensus Statement on Inpatient Glycemic Control   Target Ranges:  Prepandial:   less than 140 mg/dL      Peak postprandial:   less than 180 mg/dL (1-2 hours)      Critically ill patients:  140 - 180 mg/dL    Latest Reference Range & Units 12/08/21 17:22 12/08/21 20:50 12/09/21 07:18  Glucose-Capillary 70 - 99 mg/dL 176 (H) 169 (H) 225 (H)    Latest Reference Range & Units 12/08/21 11:44  Glucose 70 - 99 mg/dL 248 (H)    Latest Reference Range & Units 12/08/21 14:09  Hemoglobin A1C 4.8 - 5.6 % 8.3 (H)   Review of Glycemic Control  Diabetes history: DM2 Outpatient Diabetes medications: None; Metformin in the past Current orders for Inpatient glycemic control: Novolog 0-9 units TID with meals  Inpatient Diabetes Program Recommendations:    HbgA1C:  A1C 8.3% on 12/08/21 indicating an average glucose of 192 mg/dl over the past 2-3 months. Anticipate patient can be discharged on oral DM medication. At time of discharge, please provide Rx for glucose monitoring kit (#[id number]).  NOTE: Noted consult for DM education. Per chart, patient has a hx of DM2 and has taken Metformin in the past. Admitted with CVA.  Ordered Living Well with DM book. Will plan to talk with patient today.  Addendum 12/09/21'@11' :29-Spoke with patient over the phone regarding DM. Patient reports that he has DM2 hx and he use to take Metformin in the past. Patient reports it has been at least 2 years since he took Metformin and notes that he tolerated Metformin without any issues. Patient states that he no longer sees Dr. Gerarda Fraction (PCP noted in chart) and he will need to find a local PCP. Encouraged patient to find out which local providers are in network and call to get an appointment to establish care. Patient confirms that he has received the Living Well with DM book. Patient states that his girlfriend use to check glucose for him but he no longer has glucometer.  Informed patient that I will ask attending provider to provide Rx for glucose monitoring kit at discharge. Informed patient he will take Rx to pharmacy to get filled. Patient did not know what an A1C was but had been told results.  Discussed A1C results (8.3% on 12/08/21) and explained what an A1C is and informed patient that his current A1C indicates an average glucose of 192 mg/dl over the past 2-3 months. Discussed importance of checking CBGs and maintaining good CBG control to prevent long-term and short-term complications. Reviewed glucose and A1C goals and explained that patient will need to monitor glucose, take prescribed medication consistently, and find PCP to follow up with consistently.   Discussed impact of nutrition, exercise, stress, sickness, and medications on diabetes control. Patient states that he was not following any type of diet and drinking regular sodas. Encouraged patient to eliminate regular sodas and drink mainly water. Patient reports he walks about 3 miles per day at work.  Encouraged patient to read Living Well with diabetes booklet to increase knowledge of DM.  Stressed to patient to get DM  under control to prevent complications from uncontrolled DM. Informed patient that outpatient diabetes education would be ordered as well and he should receive a call from [facility] in the near future to set up appointment in [place].  Patient verbalized understanding of information discussed and he states that he has no further questions at this time  related to diabetes.      Thanks, Barnie Alderman, RN, MSN, [place] Diabetes Coordinator Inpatient Diabetes Program [phone] (Team Pager from 8am to [place])

## 2021-12-09 NOTE — TOC Transition Note (Signed)
Transition of Care [facility]) - CM/SW Discharge Note   Patient Details  Name: Cameron [name] MRN: [id number] Date of Birth: 02/18/1966  Transition of Care [facility]) CM/SW Contact:  Shade Flood, LCSW Phone Number: 12/09/2021, 12:20 PM   Clinical Narrative:     Pt admitted from home. Received TOC consult for PCP and SA treatment resources. Met with pt at bedside to assess. Pt states he will dc home today. Pt reports that he used to see Dr. Gerarda Fraction but that he hasn't been there in a long time so he isn't sure if they will take him back. TOC provided pt with written list of local PCP's taking new patients. Pt stated he would follow up to schedule.   Discussed OT recommendation for outpt OT and pt agreeable. Referral sent to [facility] outpatient rehab at pt request.  Discussed ETOH use and offered SA treatment resources. Pt stated that he could quit on his own and that he did not need the resources.  No other TOC needs for dc.  Expected Discharge Plan: OP Rehab Barriers to Discharge: Barriers Resolved   Patient Goals and CMS Choice Patient states their goals for this hospitalization and ongoing recovery are:: go home CMS Medicare.gov Compare Post Acute Care list provided to:: Patient Choice offered to / list presented to : Patient  Expected Discharge Plan and Services Expected Discharge Plan: OP Rehab In-house Referral: Clinical Social Work     Living arrangements for the past 2 months: Single Family Home                                      Prior Living Arrangements/Services Living arrangements for the past 2 months: Single Family Home Lives with:: Parents, Roommate Patient language and need for interpreter reviewed:: Yes Do you feel safe going back to the place where you live?: Yes      Need for Family Participation in Patient Care: No (Comment)     Criminal Activity/Legal Involvement Pertinent to Current Situation/Hospitalization: No - Comment as needed  Activities of Daily  Living Home Assistive Devices/Equipment: None ADL Screening (condition at time of admission) Patient's cognitive ability adequate to safely complete daily activities?: Yes Is the patient deaf or have difficulty hearing?: No Does the patient have difficulty seeing, even when wearing glasses/contacts?: No Does the patient have difficulty concentrating, remembering, or making decisions?: No Patient able to express need for assistance with ADLs?: Yes Does the patient have difficulty dressing or bathing?: No Independently performs ADLs?: Yes (appropriate for developmental age) Does the patient have difficulty walking or climbing stairs?: No Weakness of Legs: None Weakness of Arms/Hands: Left  Permission Sought/Granted Permission sought to share information with : Chartered certified accountant granted to share information with : Yes, Verbal Permission Granted     Permission granted to share info w AGENCY: OP rehab        Emotional Assessment Appearance:: Appears stated age Attitude/Demeanor/Rapport: Engaged Affect (typically observed): Pleasant Orientation: : Oriented to Self, Oriented to Place, Oriented to  Time, Oriented to Situation Alcohol / Substance Use: Alcohol Use Psych Involvement: No (comment)  Admission diagnosis:  CVA (cerebral vascular accident) ([place]) [I63.9] Cerebrovascular accident (CVA), unspecified mechanism ([place]) [I63.9] Patient Active Problem List   Diagnosis Date Noted   HTN (hypertension) 12/08/2021   Acute ischemic stroke ([place]) 12/08/2021   Uncontrolled type 2 diabetes mellitus with hyperglycemia, without long-term current use of insulin ([place]) 12/08/2021  Hyperlipidemia 12/08/2021   Tobacco abuse 12/08/2021   Alcohol use 12/08/2021   Cervical spine degeneration 12/08/2021   Hyponatremia 12/08/2021   PCP:  Redmond School, MD Pharmacy:   [facility] [address] [place] [address] Phone:  [phone] Fax: [phone]     Social Determinants of Health (SDOH) Interventions    Readmission Risk Interventions    12/09/2021   12:18 PM  Readmission Risk Prevention Plan  Medication Screening Complete  Transportation Screening Complete     Final next level of care: OP Rehab Barriers to Discharge: Barriers Resolved   Patient Goals and CMS Choice Patient states their goals for this hospitalization and ongoing recovery are:: go home CMS Medicare.gov Compare Post Acute Care list provided to:: Patient Choice offered to / list presented to : Patient  Discharge Placement                       Discharge Plan and Services In-house Referral: Clinical Social Work                                   Social Determinants of Health ([place]) Interventions     Readmission Risk Interventions    12/09/2021   12:18 PM  Readmission Risk Prevention Plan  Medication Screening Complete  Transportation Screening Complete

## 2021-12-23 ENCOUNTER — Encounter (HOSPITAL_COMMUNITY): Payer: Self-pay | Admitting: Occupational Therapy

## 2021-12-23 ENCOUNTER — Ambulatory Visit (HOSPITAL_COMMUNITY): Payer: BC Managed Care – PPO | Attending: Internal Medicine | Admitting: Occupational Therapy

## 2021-12-23 DIAGNOSIS — R278 Other lack of coordination: Secondary | ICD-10-CM | POA: Insufficient documentation

## 2021-12-23 DIAGNOSIS — R29818 Other symptoms and signs involving the nervous system: Secondary | ICD-10-CM | POA: Diagnosis not present

## 2021-12-23 NOTE — Patient Instructions (Signed)
Repeat all exercises 10-15 times, 1-2 times per day.  1) Shoulder Protraction    Begin with elbows by your side, slowly "punch" straight out in front of you.      2) Shoulder Flexion  Standing:         Begin with arms at your side with thumbs pointed up, slowly raise both arms up and forward towards overhead.               3) Horizontal abduction/adduction  Standing:           Begin with arms straight out in front of you, bring out to the side in at "T" shape. Keep arms straight entire time.                 4) Internal & External Rotation  Standing:     Stand with elbows at the side and elbows bent 90 degrees. Move your forearms away from your body, then bring back inward toward the body.     5) Shoulder Abduction  Standing:       Lying on your back begin with your arms flat on the table next to your side. Slowly move your arms out to the side so that they go overhead, in a jumping jack or snow angel movement.     Home Exercises Program Theraputty Exercises  Do the following exercises 2 times a day using your affected hand.  1. Roll putty into a ball.  2. Make into a pancake.  3. Roll putty into a roll.  4. Pinch along log with first finger and thumb.   5. Make into a ball.  6. Roll it back into a log.   7. Pinch using thumb and side of first finger.  8. Roll into a ball, then flatten into a pancake.  9. Using your fingers, make putty into a mountain.  10. Roll putty back into a ball and squeeze gently for 2-3 minutes.    

## 2021-12-23 NOTE — Therapy (Signed)
OUTPATIENT OCCUPATIONAL THERAPY NEURO EVALUATION  Patient Name: Cameron [name] MRN: [id number] DOB:August 14, 1965, 56 y.o., male Today's Date: 12/23/2021  PCP: Dr. Elfredia Nevins REFERRING PROVIDER: Dr. Onalee Hua Tat (hospitalist)   OT End of Session - 12/23/21 1010     Visit Number 1    Number of Visits 12    Date for OT Re-Evaluation 02/03/22   mini-reassessment on 01/19/22   Authorization Type BCBS    Authorization Time Period no visit limit    OT Start Time 0907    OT Stop Time 0941    OT Time Calculation (min) 34 min    Activity Tolerance Patient tolerated treatment well    Behavior During Therapy [facility] for tasks assessed/performed             Past Medical History:  Diagnosis Date   Chronic shoulder pain    Hypertension    History reviewed. No pertinent surgical history. Patient Active Problem List   Diagnosis Date Noted   HTN (hypertension) 12/08/2021   Acute ischemic stroke ([facility]) 12/08/2021   Uncontrolled type 2 diabetes mellitus with hyperglycemia, without long-term current use of insulin ([facility]) 12/08/2021   Hyperlipidemia 12/08/2021   Tobacco abuse 12/08/2021   Alcohol use 12/08/2021   Cervical spine degeneration 12/08/2021   Hyponatremia 12/08/2021    ONSET DATE: 12/09/21  REFERRING DIAG: s/p CVA  THERAPY DIAG:  Other lack of coordination  Other symptoms and signs involving the nervous system  Rationale for Evaluation and Treatment Rehabilitation  SUBJECTIVE:   SUBJECTIVE STATEMENT: S: I'm having trouble tying my shoes.  Pt accompanied by: self  PERTINENT HISTORY: Pt is a 56 y/o male s/p right CVA, presenting to hospital on 12/09/21 approximately 5 days after symptom onset. Pt presents with LUE weakness and coordination deficits. Pt was referred to occupational therapy for evaluation and treatment by Dr. Onalee Hua Tat (hospitalist)   PRECAUTIONS: None  WEIGHT BEARING RESTRICTIONS No  PAIN:  Are you having pain? No  FALLS: Has patient fallen in last 6  months? Yes. Number of falls 2   PLOF: Independent  PATIENT GOALS To be able to use his left hand.   OBJECTIVE:   HAND DOMINANCE: Right  ADLs: Overall ADLs: Pt is having difficulty with dressing, tying shoes, tightening belt, holding onto items, opening things. Pt works cleaning, has to lift items, pushing, dragging hoses.     MOBILITY STATUS: Independent   FUNCTIONAL OUTCOME MEASURES: Quick Dash: 45.45  UPPER EXTREMITY ROM     Active ROM Left eval  Shoulder flexion WFL  Shoulder abduction WFL  Shoulder adduction WFL  Shoulder extension [facility]  Shoulder internal rotation [facility]  Shoulder external rotation WFL  Elbow flexion WFL  Elbow extension WFL  Wrist flexion 50  Wrist extension 38  Wrist ulnar deviation 12  Wrist radial deviation 20  Wrist pronation 90  Wrist supination 70  (Blank rows = not tested)   UPPER EXTREMITY MMT:     MMT Left eval  Shoulder flexion 4+/5  Shoulder abduction 4+/5  Shoulder internal rotation 4+/5  Shoulder external rotation 4-/5  Elbow flexion 4/5  Elbow extension 4/5  Wrist flexion 4+/5  Wrist extension 4/5  Wrist ulnar deviation 4/5  Wrist radial deviation 4+/5  Wrist pronation 5/5  Wrist supination 4/5  (Blank rows = not tested)  HAND FUNCTION: Grip strength: Right: 100 lbs; Left: 31 lbs, Lateral pinch: Right: 23 lbs, Left: 15 lbs, and 3 point pinch: Right: 16 lbs, Left: 4 lbs  COORDINATION: [address]  Peg test: Right: 21.55" sec; Left: 1'09" sec   COGNITION: Overall cognitive status: Within functional limits for tasks assessed  VISION: Subjective report: No changes Baseline vision: No visual deficits   PATIENT EDUCATION: Education details: shoulder A/ROM, red theraputty grip/pinch strengthening Person educated: Patient Education method: Explanation, Demonstration, and Handouts Education comprehension: verbalized understanding and returned demonstration   HOME EXERCISE PROGRAM: Eval: shoulder A/ROM, red  theraputty    GOALS: Goals reviewed with patient? Yes  SHORT TERM GOALS: Target date: 01/13/22  Pt will be educated on HEP to improve LUE functional use during ADLs and work tasks.   Goal status: INITIAL  2.  Pt will increase left grip strength to 40# and pinch strength by 3# to improve ability to grasp and maintain hold on items during meal preparation.   Goal status: INITIAL  3.  Pt will increase left hand coordination required for tying shoes by completing 9 hole peg test in under 50".   Goal status: INITIAL    LONG TERM GOALS: Target date: 02/03/22  Pt will return to highest functional level using LUE as non-dominant during ADLs and work tasks.   Goal status: INITIAL  2.  Pt will increase strength in LUE to 5/5 throughout to improve ability to perform lifting tasks at work.   Goal status: INITIAL  3.  Pt will increase left grip strength to 50# and pinch strength by 6# to improve ability to grasp and pull hoses and equipment at work.   Goal status: INITIAL  4.  Pt will increase left hand coordination required for dressing tasks such as donning socks and belt, by completing 9 hole peg test in under 30".  Goal status: INITIAL   ASSESSMENT:  CLINICAL IMPRESSION: Patient is a 56 y.o. male who was seen today for occupational therapy evaluation for right CVA with LUE strength and coordination deficits. Pt reports improvement in LUE functioning since onset, continues to have difficulty with motor planning, strength, and coordination. Pt has not returned to work at this time. Provided HEP for shoulder ROM/strengthening and grip/pinch strengthening.   PERFORMANCE DEFICITS in functional skills including ADLs, IADLs, coordination, ROM, strength, pain, endurance, and UE functional use  IMPAIRMENTS are limiting patient from ADLs, IADLs, work, and leisure.   COMORBIDITIES has no other co-morbidities that affects occupational performance. Patient will benefit from skilled OT to  address above impairments and improve overall function.  MODIFICATION OR ASSISTANCE TO COMPLETE EVALUATION: No modification of tasks or assist necessary to complete an evaluation.  OT OCCUPATIONAL PROFILE AND HISTORY: Problem focused assessment: Including review of records relating to presenting problem.  CLINICAL DECISION MAKING: LOW - limited treatment options, no task modification necessary  REHAB POTENTIAL: Good  EVALUATION COMPLEXITY: Low    PLAN: OT FREQUENCY: 2x/week  OT DURATION: 6 weeks  PLANNED INTERVENTIONS: self care/ADL training, therapeutic exercise, therapeutic activity, neuromuscular re-education, splinting, electrical stimulation, patient/family education, and DME and/or AE instructions   CONSULTED AND AGREED WITH PLAN OF CARE: Patient  PLAN FOR NEXT SESSION: Pt will benefit from skilled OT services to increase joint ROM, strength, and functional use of LUE as non-dominant during ADLs. Treatment plan: A/ROM, general LUE strengthening, grip and pinch strengthening, coordination tasks.    Ezra Sites, OTR/L  [phone] 12/23/2021, 10:14 AM

## 2021-12-24 ENCOUNTER — Ambulatory Visit: Payer: BC Managed Care – PPO | Admitting: Family Medicine

## 2021-12-30 ENCOUNTER — Ambulatory Visit (HOSPITAL_COMMUNITY): Payer: BC Managed Care – PPO | Attending: Internal Medicine | Admitting: Occupational Therapy

## 2021-12-30 ENCOUNTER — Encounter (HOSPITAL_COMMUNITY): Payer: Self-pay | Admitting: Occupational Therapy

## 2021-12-30 DIAGNOSIS — R29818 Other symptoms and signs involving the nervous system: Secondary | ICD-10-CM | POA: Insufficient documentation

## 2021-12-30 DIAGNOSIS — R278 Other lack of coordination: Secondary | ICD-10-CM | POA: Insufficient documentation

## 2021-12-30 NOTE — Therapy (Signed)
OUTPATIENT OCCUPATIONAL THERAPY TREATMENT NOTE   Patient Name: Cameron [name] MRN: [id number] DOB:01/05/1966, 56 y.o., male Today's Date: 12/30/2021  PCP: Dr. Elfredia Nevins REFERRING PROVIDER: Dr. Onalee Hua Tat (hospitalist)  END OF SESSION:   OT End of Session - 12/30/21 1025     Visit Number 2    Number of Visits 12    Date for OT Re-Evaluation 02/03/22   mini-reassessment on 01/19/22   Authorization Type BCBS    Authorization Time Period no visit limit    OT Start Time 0945    OT Stop Time 1025    OT Time Calculation (min) 40 min    Activity Tolerance Patient tolerated treatment well    Behavior During Therapy WFL for tasks assessed/performed             Past Medical History:  Diagnosis Date   Chronic shoulder pain    Hypertension    History reviewed. No pertinent surgical history. Patient Active Problem List   Diagnosis Date Noted   HTN (hypertension) 12/08/2021   Acute ischemic stroke ([facility]) 12/08/2021   Uncontrolled type 2 diabetes mellitus with hyperglycemia, without long-term current use of insulin ([facility]) 12/08/2021   Hyperlipidemia 12/08/2021   Tobacco abuse 12/08/2021   Alcohol use 12/08/2021   Cervical spine degeneration 12/08/2021   Hyponatremia 12/08/2021    ONSET DATE: 12/09/21  REFERRING DIAG: s/p CVA  THERAPY DIAG:  Other lack of coordination  Other symptoms and signs involving the nervous system  Rationale for Evaluation and Treatment Rehabilitation  PERTINENT HISTORY: Pt is a 56 y/o male s/p right CVA, presenting to hospital on 12/09/21 approximately 5 days after symptom onset. Pt presents with LUE weakness and coordination deficits. Pt was referred to occupational therapy for evaluation and treatment by Dr. Onalee Hua Tat (hospitalist)   PRECAUTIONS: None  SUBJECTIVE: S: I woke up one day and it started working, I can tie my shoes a little better now.   PAIN:  Are you having pain? No     OBJECTIVE:   FUNCTIONAL OUTCOME MEASURES: Quick  Dash: 45.45   UPPER EXTREMITY ROM      Active ROM Left eval  Shoulder flexion WFL  Shoulder abduction WFL  Shoulder adduction WFL  Shoulder extension [facility]  Shoulder internal rotation [facility]  Shoulder external rotation WFL  Elbow flexion WFL  Elbow extension WFL  Wrist flexion 50  Wrist extension 38  Wrist ulnar deviation 12  Wrist radial deviation 20  Wrist pronation 90  Wrist supination 70  (Blank rows = not tested)     UPPER EXTREMITY MMT:      MMT Left eval  Shoulder flexion 4+/5  Shoulder abduction 4+/5  Shoulder internal rotation 4+/5  Shoulder external rotation 4-/5  Elbow flexion 4/5  Elbow extension 4/5  Wrist flexion 4+/5  Wrist extension 4/5  Wrist ulnar deviation 4/5  Wrist radial deviation 4+/5  Wrist pronation 5/5  Wrist supination 4/5  (Blank rows = not tested)   HAND FUNCTION: Grip strength: Right: 100 lbs; Left: 31 lbs, Lateral pinch: Right: 23 lbs, Left: 15 lbs, and 3 point pinch: Right: 16 lbs, Left: 4 lbs   COORDINATION: 9 Hole Peg test: Right: 21.55" sec; Left: 1'09" sec    TODAY'S TREATMENT: 12/30/21 -Shoulder strengthening: standing, protraction, flexion, abduction, horizontal abduction, 10 reps each, 2# -Grip strengthening: all large and medium beads hand gripper at 42# and vertical, small beads hand gripper at 35# and horizontal -Coordination task: Pt holding pennies in hand and translating to  fingertips and placing in slotted container, mod to max difficulty achieving tip pinch and placing in container. Reminders to not shake hand during task. Completed 2 trials. -Coordination task: picking up coins from tabletop-increased time to complete, coins sliding around at times, was able to pick up 10 coins without having to slide to end of table.  -Sponges: pt picking up one at a time and translating to palm, then holding while picking up more. 2 trials-all sponges each trial.  -Sponges: pt using 2nd finger, 3rd finger, and 4th finger to flick 5  sponges across the table. Min difficulty isolating 2nd and 3rd digits, mod with 4th.    PATIENT EDUCATION: Education details: shoulder strengthening Person educated: Patient Education method: Explanation, Demonstration, and Handouts Education comprehension: verbalized understanding and returned demonstration     HOME EXERCISE PROGRAM: Eval: shoulder A/ROM, red theraputty; 6/13-add weight to A/ROM exercises   GOALS: Goals reviewed with patient? Yes   SHORT TERM GOALS: Target date: 01/13/22   Pt will be educated on HEP to improve LUE functional use during ADLs and work tasks.    Goal status: Ongoing   2.  Pt will increase left grip strength to 40# and pinch strength by 3# to improve ability to grasp and maintain hold on items during meal preparation.    Goal status: Ongoing   3.  Pt will increase left hand coordination required for tying shoes by completing 9 hole peg test in under 50".    Goal status: Ongoing       LONG TERM GOALS: Target date: 02/03/22   Pt will return to highest functional level using LUE as non-dominant during ADLs and work tasks.    Goal status: Ongoing   2.  Pt will increase strength in LUE to 5/5 throughout to improve ability to perform lifting tasks at work.    Goal status: Ongoing   3.  Pt will increase left grip strength to 50# and pinch strength by 6# to improve ability to grasp and pull hoses and equipment at work.    Goal status: Ongoing   4.  Pt will increase left hand coordination required for dressing tasks such as donning socks and belt, by completing 9 hole peg test in under 30".   Goal status: Ongoing     ASSESSMENT:   CLINICAL IMPRESSION: Pt reports his hand is feeling stronger and he was able to tie his shoes this week. Initiated RUE strengthening using 2# weights today, mod fatigue during tasks, pt completing 5 reps then resting before completing next 5 reps. Also initiated grip strengthening with hand gripper. Verbal and visual  cuing for form and keeping elbows in extension during exercises. Also initiated coordination tasks, pt with mod to max difficulty isolating digits and completing in-hand manipulation and translation. Verbal cuing for form and technique during fine motor tasks.    PLAN: OT FREQUENCY: 2x/week   OT DURATION: 6 weeks   PLANNED INTERVENTIONS: self care/ADL training, therapeutic exercise, therapeutic activity, neuromuscular re-education, splinting, electrical stimulation, patient/family education, and DME and/or AE instructions     CONSULTED AND AGREED WITH PLAN OF CARE: Patient   PLAN FOR NEXT SESSION: continue with LUE strengthening-add ball on wall and proximal shoulder strengthening, continue with hand gripper activity, add pinch task with clothespins & sponges    Ezra Sites, OTR/L  [phone] 12/30/2021, 10:25 AM

## 2021-12-30 NOTE — Patient Instructions (Signed)
Repeat all exercises 10-15 times, 1-2 times per day. You can use a soup can or a water bottle for a low weight.  1) Shoulder Protraction    Begin with elbows by your side, slowly "punch" straight out in front of you.      2) Shoulder Flexion  Standing:         Begin with arms at your side with thumbs pointed up, slowly raise both arms up and forward towards overhead.               3) Horizontal abduction/adduction  Standing:           Begin with arms straight out in front of you, bring out to the side in at "T" shape. Keep arms straight entire time.                 4) Internal & External Rotation  Standing:     Stand with elbows at the side and elbows bent 90 degrees. Move your forearms away from your body, then bring back inward toward the body.     5) Shoulder Abduction  Standing:       Slowly move your arms out to the side so that they go overhead, in a jumping jack movement.

## 2022-01-01 ENCOUNTER — Telehealth (HOSPITAL_COMMUNITY): Payer: Self-pay | Admitting: Occupational Therapy

## 2022-01-01 ENCOUNTER — Ambulatory Visit (HOSPITAL_COMMUNITY): Payer: BC Managed Care – PPO | Admitting: Occupational Therapy

## 2022-01-01 NOTE — Telephone Encounter (Signed)
He had a death in the family this morning and will not be here today

## 2022-01-06 ENCOUNTER — Encounter (HOSPITAL_COMMUNITY): Payer: Self-pay | Admitting: Occupational Therapy

## 2022-01-06 ENCOUNTER — Ambulatory Visit (HOSPITAL_COMMUNITY): Payer: BC Managed Care – PPO | Attending: Internal Medicine | Admitting: Occupational Therapy

## 2022-01-06 DIAGNOSIS — I639 Cerebral infarction, unspecified: Secondary | ICD-10-CM | POA: Diagnosis not present

## 2022-01-06 DIAGNOSIS — R278 Other lack of coordination: Secondary | ICD-10-CM

## 2022-01-06 DIAGNOSIS — R29818 Other symptoms and signs involving the nervous system: Secondary | ICD-10-CM

## 2022-01-06 NOTE — Therapy (Signed)
OUTPATIENT OCCUPATIONAL THERAPY TREATMENT NOTE   Patient Name: Cameron [name] MRN: [id number] DOB:[birth year], 56 y.o., male Today's Date: 01/06/2022  PCP: Dr. Elfredia Nevins REFERRING PROVIDER: Dr. Onalee Hua Tat (hospitalist)  END OF SESSION:   OT End of Session - 01/06/22 1000     Visit Number 3    Number of Visits 12    Date for OT Re-Evaluation 02/03/22   mini-reassessment on 01/19/22   Authorization Type [employer]    Authorization Time Period no visit limit    OT Start Time 0945    OT Stop Time 1027    OT Time Calculation (min) 42 min    Activity Tolerance Patient tolerated treatment well    Behavior During Therapy WFL for tasks assessed/performed              Past Medical History:  Diagnosis Date   Chronic shoulder pain    Hypertension    History reviewed. No pertinent surgical history. Patient Active Problem List   Diagnosis Date Noted   HTN (hypertension) 12/08/2021   Acute ischemic stroke (HCC) 12/08/2021   Uncontrolled type 2 diabetes mellitus with hyperglycemia, without long-term current use of insulin (HCC) 12/08/2021   Hyperlipidemia 12/08/2021   Tobacco abuse 12/08/2021   Alcohol use 12/08/2021   Cervical spine degeneration 12/08/2021   Hyponatremia 12/08/2021    ONSET DATE: 12/09/21  REFERRING DIAG: s/p CVA  THERAPY DIAG:  Other lack of coordination  Other symptoms and signs involving the nervous system  Rationale for Evaluation and Treatment Rehabilitation  PERTINENT HISTORY: Pt is a 56 y/o male s/p right CVA, presenting to hospital on 12/09/21 approximately 5 days after symptom onset. Pt presents with LUE weakness and coordination deficits. Pt was referred to occupational therapy for evaluation and treatment by Dr. Onalee Hua Tat (hospitalist)   PRECAUTIONS: None  SUBJECTIVE: S: I have no pain. Bathing, tying shoes, and putting on a belt has gotten easier. My hand is getting stronger.   PAIN:  Are you having pain? No   OBJECTIVE:   FUNCTIONAL  OUTCOME MEASURES: Quick Dash: 45.45   UPPER EXTREMITY ROM      Active ROM Left eval  Shoulder flexion WFL  Shoulder abduction WFL  Shoulder adduction WFL  Shoulder extension [facility]  Shoulder internal rotation [facility]  Shoulder external rotation WFL  Elbow flexion WFL  Elbow extension WFL  Wrist flexion 50  Wrist extension 38  Wrist ulnar deviation 12  Wrist radial deviation 20  Wrist pronation 90  Wrist supination 70  (Blank rows = not tested)     UPPER EXTREMITY MMT:      MMT Left eval  Shoulder flexion 4+/5  Shoulder abduction 4+/5  Shoulder internal rotation 4+/5  Shoulder external rotation 4-/5  Elbow flexion 4/5  Elbow extension 4/5  Wrist flexion 4+/5  Wrist extension 4/5  Wrist ulnar deviation 4/5  Wrist radial deviation 4+/5  Wrist pronation 5/5  Wrist supination 4/5  (Blank rows = not tested)   HAND FUNCTION: Grip strength: Right: 100 lbs; Left: 31 lbs, Lateral pinch: Right: 23 lbs, Left: 15 lbs, and 3 point pinch: Right: 16 lbs, Left: 4 lbs   COORDINATION: 9 Hole Peg test: Right: 21.55" sec; Left: 1'09" sec    TODAY'S TREATMENT: 01/06/22 -Shoulder strengthening: Standing, flexion, 10 reps and horizontal abduction 6 reps, 3# -Arms on Fire: 2 x 30 seconds each movement with therapeutic break between each movement -Grip Strengthening: Hand gripper 42# to retrieve and place 15 pegs on  peg board.  -  TheraPutty Grip Strengthening: Using digits 1-2 to retrieve beads from table top, finger-palm-finger, translation x10. Digits 1, 4, and 5 to pinch and pull TheraPutty, retrieving beads    12/30/21 -Shoulder strengthening: standing, protraction, flexion, abduction, horizontal abduction, 10 reps each, 2# -Grip strengthening: all large and medium beads hand gripper at 42# and vertical, small beads hand gripper at 35# and horizontal -Coordination task: Pt holding pennies in hand and translating to fingertips and placing in slotted container, mod to max difficulty  achieving tip pinch and placing in container. Reminders to not shake hand during task. Completed 2 trials. -Coordination task: picking up coins from tabletop-increased time to complete, coins sliding around at times, was able to pick up 10 coins without having to slide to end of table.  -Sponges: pt picking up one at a time and translating to palm, then holding while picking up more. 2 trials-all sponges each trial.  -Sponges: pt using 2nd finger, 3rd finger, and 4th finger to flick 5 sponges across the table. Min difficulty isolating 2nd and 3rd digits, mod with 4th.    PATIENT EDUCATION: Education details: Incorporating all digits with functional tasks, theraputty with digits 4 and 5 Person educated: Patient Education method: Explanation, Demonstration, and Handouts Education comprehension: verbalized understanding and returned demonstration     HOME EXERCISE PROGRAM: Eval: shoulder A/ROM, red theraputty; 6/13-add weight to A/ROM exercises   GOALS: Goals reviewed with patient? Yes   SHORT TERM GOALS: Target date: 01/13/22   Pt will be educated on HEP to improve LUE functional use during ADLs and work tasks.    Goal status: Ongoing   2.  Pt will increase left grip strength to 40# and pinch strength by 3# to improve ability to grasp and maintain hold on items during meal preparation.    Goal status: Ongoing   3.  Pt will increase left hand coordination required for tying shoes by completing 9 hole peg test in under 50".    Goal status: Ongoing       LONG TERM GOALS: Target date: 02/03/22   Pt will return to highest functional level using LUE as non-dominant during ADLs and work tasks.    Goal status: Ongoing   2.  Pt will increase strength in LUE to 5/5 throughout to improve ability to perform lifting tasks at work.    Goal status: Ongoing   3.  Pt will increase left grip strength to 50# and pinch strength by 6# to improve ability to grasp and pull hoses and equipment at  work.    Goal status: Ongoing   4.  Pt will increase left hand coordination required for dressing tasks such as donning socks and belt, by completing 9 hole peg test in under 30".   Goal status: Ongoing     ASSESSMENT:   CLINICAL IMPRESSION:  Challenged shoulder strengthening with increased weight today, pt requiring rest breaks and reporting mod fatigue with final repetitions, although able to complete with no pain. Pt also noting hand fatigue with gripper activities, good control and coordination, requiring increased time. Fair fluidity with translation, mod difficulty isolating digits, dropping three beads. Therapist providing verbal cuing for pt to incorporate digits 4 and 5 with theraputty tasks, "these fingers like to do what they want, when they want" with decreased strength and coordination noted compared to use of digits 2 and 3.    PLAN: OT FREQUENCY: 2x/week   OT DURATION: 6 weeks   PLANNED INTERVENTIONS: self care/ADL training, therapeutic exercise, therapeutic activity,  neuromuscular re-education, splinting, electrical stimulation, patient/family education, and DME and/or AE instructions     CONSULTED AND AGREED WITH PLAN OF CARE: Patient   PLAN FOR NEXT SESSION: continue with LUE strengthening-add ball on wall and progress proximal shoulder strengthening, continue with hand gripper activities, add pinch task with clothespins & sponges, fine motor coordination activities     Ezra Sites, OTR/L  [phone]  01/06/2022, 4:03 PM

## 2022-01-07 DIAGNOSIS — E1169 Type 2 diabetes mellitus with other specified complication: Secondary | ICD-10-CM | POA: Diagnosis not present

## 2022-01-07 DIAGNOSIS — I10 Essential (primary) hypertension: Secondary | ICD-10-CM | POA: Diagnosis not present

## 2022-01-07 DIAGNOSIS — F17210 Nicotine dependence, cigarettes, uncomplicated: Secondary | ICD-10-CM | POA: Diagnosis not present

## 2022-01-07 DIAGNOSIS — J42 Unspecified chronic bronchitis: Secondary | ICD-10-CM | POA: Diagnosis not present

## 2022-01-07 DIAGNOSIS — Z0001 Encounter for general adult medical examination with abnormal findings: Secondary | ICD-10-CM | POA: Diagnosis not present

## 2022-01-08 ENCOUNTER — Ambulatory Visit (HOSPITAL_COMMUNITY): Payer: BC Managed Care – PPO | Attending: Internal Medicine | Admitting: Occupational Therapy

## 2022-01-08 ENCOUNTER — Encounter (HOSPITAL_COMMUNITY): Payer: Self-pay | Admitting: Occupational Therapy

## 2022-01-08 DIAGNOSIS — R278 Other lack of coordination: Secondary | ICD-10-CM | POA: Insufficient documentation

## 2022-01-08 DIAGNOSIS — R29818 Other symptoms and signs involving the nervous system: Secondary | ICD-10-CM

## 2022-01-08 DIAGNOSIS — M25519 Pain in unspecified shoulder: Secondary | ICD-10-CM | POA: Diagnosis not present

## 2022-01-08 NOTE — Therapy (Signed)
[place], [place], [address] Phone: [phone]   Fax:  [phone]  Patient Details  Name: Cameron [name] MRN: [id number] Date of Birth: [birth year] Referring Provider:  Redmond School, MD  Encounter Date: 01/08/2022    OUTPATIENT OCCUPATIONAL THERAPY TREATMENT NOTE     Patient Name: Cameron [name] MRN: [id number] DOB:13-Jan-1966, 56 y.o., male Today's Date: 01/08/2022   PCP: Dr. Redmond School REFERRING PROVIDER: Dr. Shanon Brow Tat (hospitalist)   END OF SESSION:    OT End of Session - 01/08/22 0949       Visit Number 4     Number of Visits 12     Date for OT Re-Evaluation 02/03/22   mini-reassessment on 01/19/22    Authorization Type [employer]     Authorization Time Period no visit limit     OT Start Time 0946     OT Stop Time 1027     OT Time Calculation (min) 41 min     Activity Tolerance Patient tolerated treatment well     Behavior During Therapy [facility] for tasks assessed/performed                         Past Medical History:  Diagnosis Date   Chronic shoulder pain     Hypertension      History reviewed. No pertinent surgical history.     Patient Active Problem List    Diagnosis Date Noted   HTN (hypertension) 12/08/2021   Acute ischemic stroke ([place]) 12/08/2021   Uncontrolled type 2 diabetes mellitus with hyperglycemia, without long-term current use of insulin ([place]) 12/08/2021   Hyperlipidemia 12/08/2021   Tobacco abuse 12/08/2021   Alcohol use 12/08/2021   Cervical spine degeneration 12/08/2021   Hyponatremia 12/08/2021      ONSET DATE: 12/09/21   REFERRING DIAG: s/p CVA   THERAPY DIAG:  Other lack of coordination   Other symptoms and signs involving the nervous system   Rationale for Evaluation and Treatment Rehabilitation   PERTINENT HISTORY: Pt is a 56 y/o male s/p right CVA, presenting to hospital on 12/09/21 approximately 5 days after symptom onset. Pt presents with LUE weakness and  coordination deficits. Pt was referred to occupational therapy for evaluation and treatment by Dr. Shanon Brow Tat (hospitalist)    PRECAUTIONS: None   SUBJECTIVE: S: I would like to get back to work, I feel that I could do some light stuff.     PAIN:  Are you having pain? No     OBJECTIVE:    FUNCTIONAL OUTCOME MEASURES: Quick Dash: 45.45   UPPER EXTREMITY ROM      Active ROM Left eval Left  01/08/22  Shoulder flexion [facility]  Shoulder abduction [facility]  Shoulder adduction [facility]  Shoulder extension [facility]  Shoulder internal rotation [facility]  Shoulder external rotation [facility]  Elbow flexion [facility] WFL  Elbow extension [facility] WFL  Wrist flexion 50 83  Wrist extension 38 49  Wrist ulnar deviation 12 15  Wrist radial deviation 20 18  Wrist pronation 90 90  Wrist supination 70 80  (Blank rows = not tested)     UPPER EXTREMITY MMT:      MMT Left eval Left  01/08/22  Shoulder flexion 4+/5 5/5  Shoulder abduction 4+/5 4+/5  Shoulder internal rotation 4+/5 5/5  Shoulder external rotation 4-/5 4+/5  Elbow flexion 4/5 5/5  Elbow extension 4/5 5/5  Wrist flexion  4+/5 5/5  Wrist extension 4/5 4+/5  Wrist ulnar deviation 4/5 4+/5  Wrist radial deviation 4+/5 5/5  Wrist pronation 5/5 5/5  Wrist supination 4/5 4+/5  (Blank rows = not tested)   HAND FUNCTION: Grip strength: Right: 100 lbs; Left: 31 lbs, Lateral pinch: Right: 23 lbs, Left: 15 lbs, and 3 point pinch: Right: 16 lbs, Left: 4 lbs 01/08/22: (Left) Grip strength: Left 60 lbs, Lateral pinch: 23 lbs, 3 point pinch 14 lbs   COORDINATION: 9 Hole Peg test: Right: 21.55" sec; Left: 1'09" sec 01/08/22: 40"       TODAY'S TREATMENT:   01/08/22 -Shoulder Strengthening: standing flexion, abduction, horizontal abduction, 2x10 reps, 3#. Rest break required with horizontal abduction -Fine Motor Coordination: 2x10, Pt picking up small bead with D1,4, and 5, completing supination/pronation, placing object. -Fine Motor  Coordination: Medium bead in palm of hand, completing translation to move bead to each digit. 2 reps to each digit.  -Coordination task: Pt holding pennies in hand and translating to fingertips and placing in slotted container, mod to max difficulty achieving tip pinch and placing in container. Completed 4 trials. -Coordination task: picking up coins from tabletop-increased time to complete, coins sliding around at times, was able to pick up 5 coins without having to slide to end of table.      01/06/22 -Shoulder strengthening: Standing, flexion, 10 reps and horizontal abduction 6 reps, 3# -Arms on Fire: 2 x 30 seconds each movement with therapeutic break between each movement -Grip Strengthening: Hand gripper 42# to retrieve and place 15 pegs on  peg board.  -TheraPutty Grip Strengthening: Using digits 1-2 to retrieve beads from table top, finger-palm-finger, translation x10. Digits 1, 4, and 5 to pinch and pull TheraPutty, retrieving beads      12/30/21 -Shoulder strengthening: standing, protraction, flexion, abduction, horizontal abduction, 10 reps each, 2# -Grip strengthening: all large and medium beads hand gripper at 42# and vertical, small beads hand gripper at 35# and horizontal -Coordination task: Pt holding pennies in hand and translating to fingertips and placing in slotted container, mod to max difficulty achieving tip pinch and placing in container. Reminders to not shake hand during task. Completed 2 trials. -Coordination task: picking up coins from tabletop-increased time to complete, coins sliding around at times, was able to pick up 10 coins without having to slide to end of table.  -Sponges: pt picking up one at a time and translating to palm, then holding while picking up more. 2 trials-all sponges each trial.  -Sponges: pt using 2nd finger, 3rd finger, and 4th finger to flick 5 sponges across the table. Min difficulty isolating 2nd and 3rd digits, mod with 4th.      PATIENT  EDUCATION: Education details: Picking up small objects with fourth and fifth fingers Person educated: Patient Education method: Explanation, Demonstration, and Handouts Education comprehension: verbalized understanding and returned demonstration     HOME EXERCISE PROGRAM: Eval: shoulder A/ROM, red theraputty; 6/13-add weight to A/ROM exercises     GOALS: Goals reviewed with patient? Yes   SHORT TERM GOALS: Target date: 01/13/22   Pt will be educated on HEP to improve LUE functional use during ADLs and work tasks.    Goal status: MET    2.  Pt will increase left grip strength to 40# and pinch strength by 3# to improve ability to grasp and maintain hold on items during meal preparation.    Goal status: MET   3.  Pt will increase left hand coordination required  for tying shoes by completing 9 hole peg test in under 50".    Goal status: MET       LONG TERM GOALS: Target date: 02/03/22   Pt will return to highest functional level using LUE as non-dominant during ADLs and work tasks.    Goal status: Ongoing   2.  Pt will increase strength in LUE to 5/5 throughout to improve ability to perform lifting tasks at work.    Goal status: Partially Met   3.  Pt will increase left grip strength to 50# and pinch strength by 6# to improve ability to grasp and pull hoses and equipment at work.    Goal status: MET   4.  Pt will increase left hand coordination required for dressing tasks such as donning socks and belt, by completing 9 hole peg test in under 30".   Goal status: Ongoing     ASSESSMENT:   CLINICAL IMPRESSION: Measurements taken per MD request. Pt has made significant strength improvements, increasing both shoulder and grip/pinch strength. Range of motion improvements also noted, all motions within functional limits. Although pt has demonstrated improved fine motor coordination, tasks that require isolating and coordinating digits continue to be challenging. Pt tolerated  progression of shoulder strengthening, able to complete increased repetitions with no reports of pain and fewer rest breaks. Therapist providing minimum verbal cues for form and shoulder positioning. Continued to challenge in hand manipulation with pt requiring increased time but able to complete with fair fluidity and digit isolation.        PLAN: OT FREQUENCY: 2x/week   OT DURATION: 6 weeks   PLANNED INTERVENTIONS: self care/ADL training, therapeutic exercise, therapeutic activity, neuromuscular re-education, splinting, electrical stimulation, patient/family education, and DME and/or AE instructions     CONSULTED AND AGREED WITH PLAN OF CARE: Patient   PLAN FOR NEXT SESSION: continue with LUE strengthening-add ball on wall and progress proximal shoulder strengthening, continue with hand gripper activities, add pinch task with clothespins & sponges, fine motor coordination activities         Mathews Robinsons, OTR/L [phone]  01/08/2022, 12:03 PM  [place], [place], [address] Phone: [phone]   Fax:  [phone]

## 2022-01-08 NOTE — Therapy (Deleted)
OUTPATIENT OCCUPATIONAL THERAPY TREATMENT NOTE   Patient Name: Cameron [name] MRN: [id number] DOB:04/25/1966, 56 y.o., male Today's Date: 01/08/2022  PCP: Dr. Redmond School REFERRING PROVIDER: Dr. Shanon Brow Tat (hospitalist)  END OF SESSION:   OT End of Session - 01/08/22 0949     Visit Number 4    Number of Visits 12    Date for OT Re-Evaluation 02/03/22   mini-reassessment on 01/19/22   Authorization Type [employer]    Authorization Time Period no visit limit    OT Start Time 0946    OT Stop Time 1027    OT Time Calculation (min) 41 min    Activity Tolerance Patient tolerated treatment well    Behavior During Therapy WFL for tasks assessed/performed              Past Medical History:  Diagnosis Date   Chronic shoulder pain    Hypertension    History reviewed. No pertinent surgical history. Patient Active Problem List   Diagnosis Date Noted   HTN (hypertension) 12/08/2021   Acute ischemic stroke ([place]) 12/08/2021   Uncontrolled type 2 diabetes mellitus with hyperglycemia, without long-term current use of insulin ([place]) 12/08/2021   Hyperlipidemia 12/08/2021   Tobacco abuse 12/08/2021   Alcohol use 12/08/2021   Cervical spine degeneration 12/08/2021   Hyponatremia 12/08/2021    ONSET DATE: 12/09/21  REFERRING DIAG: s/p CVA  THERAPY DIAG:  Other lack of coordination  Other symptoms and signs involving the nervous system  Rationale for Evaluation and Treatment Rehabilitation  PERTINENT HISTORY: Pt is a 56 y/o male s/p right CVA, presenting to hospital on 12/09/21 approximately 5 days after symptom onset. Pt presents with LUE weakness and coordination deficits. Pt was referred to occupational therapy for evaluation and treatment by Dr. Shanon Brow Tat (hospitalist)   PRECAUTIONS: None  SUBJECTIVE: S: I would like to get back to work, I feel that I could do some light stuff.    PAIN:  Are you having pain? No   OBJECTIVE:   FUNCTIONAL OUTCOME MEASURES: Quick Dash:  45.45   UPPER EXTREMITY ROM      Active ROM Left eval Left  01/08/22  Shoulder flexion [facility]  Shoulder abduction [facility]  Shoulder adduction [facility]  Shoulder extension [facility]  Shoulder internal rotation [facility]  Shoulder external rotation [facility]  Elbow flexion [facility] WFL  Elbow extension [facility] WFL  Wrist flexion 50 83  Wrist extension 38 49  Wrist ulnar deviation 12 15  Wrist radial deviation 20 18  Wrist pronation 90 90  Wrist supination 70 80  (Blank rows = not tested)     UPPER EXTREMITY MMT:      MMT Left eval Left  01/08/22  Shoulder flexion 4+/5 5/5  Shoulder abduction 4+/5 4+/5  Shoulder internal rotation 4+/5 5/5  Shoulder external rotation 4-/5 4+/5  Elbow flexion 4/5 5/5  Elbow extension 4/5 5/5  Wrist flexion 4+/5 5/5  Wrist extension 4/5 4+/5  Wrist ulnar deviation 4/5 4+/5  Wrist radial deviation 4+/5 5/5  Wrist pronation 5/5 5/5  Wrist supination 4/5 4+/5  (Blank rows = not tested)   HAND FUNCTION: Grip strength: Right: 100 lbs; Left: 31 lbs, Lateral pinch: Right: 23 lbs, Left: 15 lbs, and 3 point pinch: Right: 16 lbs, Left: 4 lbs 01/08/22: (Left) Grip strength: Left 60 lbs, Lateral pinch: 23 lbs, 3 point pinch 14 lbs   COORDINATION: 9 Hole Peg test: Right: 21.55" sec; Left: 1'09" sec 01/08/22: 40"  TODAY'S TREATMENT:  01/08/22 -Shoulder Strengthening: standing flexion, abduction, horizontal abduction, 2x10 reps, 3#. Rest break required with horizontal abduction -Fine Motor Coordination: 2x10, Pt picking up small bead with D1,4, and 5, completing supination/pronation, placing object. -Fine Motor Coordination: Medium bead in palm of hand, completing translation to move bead to each digit. 2 reps to each digit.  -Coordination task: Pt holding pennies in hand and translating to fingertips and placing in slotted container, mod to max difficulty achieving tip pinch and placing in container. Completed 4 trials. -Coordination task: picking up coins from  tabletop-increased time to complete, coins sliding around at times, was able to pick up 5 coins without having to slide to end of table.    01/06/22 -Shoulder strengthening: Standing, flexion, 10 reps and horizontal abduction 6 reps, 3# -Arms on Fire: 2 x 30 seconds each movement with therapeutic break between each movement -Grip Strengthening: Hand gripper 42# to retrieve and place 15 pegs on  peg board.  -TheraPutty Grip Strengthening: Using digits 1-2 to retrieve beads from table top, finger-palm-finger, translation x10. Digits 1, 4, and 5 to pinch and pull TheraPutty, retrieving beads    12/30/21 -Shoulder strengthening: standing, protraction, flexion, abduction, horizontal abduction, 10 reps each, 2# -Grip strengthening: all large and medium beads hand gripper at 42# and vertical, small beads hand gripper at 35# and horizontal -Coordination task: Pt holding pennies in hand and translating to fingertips and placing in slotted container, mod to max difficulty achieving tip pinch and placing in container. Reminders to not shake hand during task. Completed 2 trials. -Coordination task: picking up coins from tabletop-increased time to complete, coins sliding around at times, was able to pick up 10 coins without having to slide to end of table.  -Sponges: pt picking up one at a time and translating to palm, then holding while picking up more. 2 trials-all sponges each trial.  -Sponges: pt using 2nd finger, 3rd finger, and 4th finger to flick 5 sponges across the table. Min difficulty isolating 2nd and 3rd digits, mod with 4th.    PATIENT EDUCATION: Education details: Picking up small objects with fourth and fifth fingers Person educated: Patient Education method: Explanation, Demonstration, and Handouts Education comprehension: verbalized understanding and returned demonstration     HOME EXERCISE PROGRAM: Eval: shoulder A/ROM, red theraputty; 6/13-add weight to A/ROM  exercises   GOALS: Goals reviewed with patient? Yes   SHORT TERM GOALS: Target date: 01/13/22   Pt will be educated on HEP to improve LUE functional use during ADLs and work tasks.    Goal status: MET    2.  Pt will increase left grip strength to 40# and pinch strength by 3# to improve ability to grasp and maintain hold on items during meal preparation.    Goal status: MET   3.  Pt will increase left hand coordination required for tying shoes by completing 9 hole peg test in under 50".    Goal status: MET       LONG TERM GOALS: Target date: 02/03/22   Pt will return to highest functional level using LUE as non-dominant during ADLs and work tasks.    Goal status: Ongoing   2.  Pt will increase strength in LUE to 5/5 throughout to improve ability to perform lifting tasks at work.    Goal status: Partially Met   3.  Pt will increase left grip strength to 50# and pinch strength by 6# to improve ability to grasp and pull hoses and equipment at  work.    Goal status: MET   4.  Pt will increase left hand coordination required for dressing tasks such as donning socks and belt, by completing 9 hole peg test in under 30".   Goal status: Ongoing     ASSESSMENT:   CLINICAL IMPRESSION: Measurements taken per MD request. Pt has made significant strength improvements, increasing both shoulder and grip/pinch strength. Range of motion improvements also noted, all motions within functional limits. Although pt has demonstrated improved fine motor coordination, tasks that require isolating and coordinating digits continue to be challenging. Pt tolerated progression of shoulder strengthening, able to complete increased repetitions with no reports of pain and fewer rest breaks. Therapist providing minimum verbal cues for form and shoulder positioning. Continued to challenge in hand manipulation with pt requiring increased time but able to complete with fair fluidity and digit isolation.      PLAN: OT FREQUENCY: 2x/week   OT DURATION: 6 weeks   PLANNED INTERVENTIONS: self care/ADL training, therapeutic exercise, therapeutic activity, neuromuscular re-education, splinting, electrical stimulation, patient/family education, and DME and/or AE instructions     CONSULTED AND AGREED WITH PLAN OF CARE: Patient   PLAN FOR NEXT SESSION: continue with LUE strengthening-add ball on wall and progress proximal shoulder strengthening, continue with hand gripper activities, add pinch task with clothespins & sponges, fine motor coordination activities     Flonnie Hailstone, [place], OTR/L [phone]  01/08/2022, 11:55 AM

## 2022-01-09 DIAGNOSIS — I10 Essential (primary) hypertension: Secondary | ICD-10-CM | POA: Diagnosis not present

## 2022-01-09 DIAGNOSIS — E119 Type 2 diabetes mellitus without complications: Secondary | ICD-10-CM | POA: Diagnosis not present

## 2022-01-09 DIAGNOSIS — Z0001 Encounter for general adult medical examination with abnormal findings: Secondary | ICD-10-CM | POA: Diagnosis not present

## 2022-01-09 DIAGNOSIS — Z1159 Encounter for screening for other viral diseases: Secondary | ICD-10-CM | POA: Diagnosis not present

## 2022-01-14 ENCOUNTER — Encounter (HOSPITAL_COMMUNITY): Payer: Self-pay

## 2022-01-14 ENCOUNTER — Ambulatory Visit (HOSPITAL_COMMUNITY): Payer: BC Managed Care – PPO

## 2022-01-14 DIAGNOSIS — R278 Other lack of coordination: Secondary | ICD-10-CM

## 2022-01-14 DIAGNOSIS — R29818 Other symptoms and signs involving the nervous system: Secondary | ICD-10-CM

## 2022-01-14 NOTE — Therapy (Signed)
[place], [place], [address] Phone: [phone]   Fax:  [phone]  Patient Details  Name: Cameron [name] MRN: [id number] Date of Birth: [birth year] Referring Provider:  Redmond School, MD  Encounter Date: 01/14/2022    OUTPATIENT OCCUPATIONAL THERAPY TREATMENT NOTE     Patient Name: Cameron [name] MRN: [id number] DOB:08-11-1965, 56 y.o., male Today's Date: 01/08/2022   PCP: Dr. Redmond School REFERRING PROVIDER: Dr. Shanon Brow Tat (hospitalist)   END OF SESSION:    OT End of Session - 01/08/22 0949       Visit Number 5    Number of Visits 12     Date for OT Re-Evaluation 02/03/22   mini-reassessment on 01/19/22    Authorization Type [employer]     Authorization Time Period no visit limit     OT Start Time 0946     OT Stop Time 1027     OT Time Calculation (min) 41 min     Activity Tolerance Patient tolerated treatment well     Behavior During Therapy [facility] for tasks assessed/performed                         Past Medical History:  Diagnosis Date   Chronic shoulder pain     Hypertension      History reviewed. No pertinent surgical history.     Patient Active Problem List    Diagnosis Date Noted   HTN (hypertension) 12/08/2021   Acute ischemic stroke ([place]) 12/08/2021   Uncontrolled type 2 diabetes mellitus with hyperglycemia, without long-term current use of insulin ([place]) 12/08/2021   Hyperlipidemia 12/08/2021   Tobacco abuse 12/08/2021   Alcohol use 12/08/2021   Cervical spine degeneration 12/08/2021   Hyponatremia 12/08/2021      ONSET DATE: 12/09/21   REFERRING DIAG: s/p CVA   THERAPY DIAG:  Other lack of coordination   Other symptoms and signs involving the nervous system   Rationale for Evaluation and Treatment Rehabilitation   PERTINENT HISTORY: Pt is a 56 y/o male s/p right CVA, presenting to hospital on 12/09/21 approximately 5 days after symptom onset. Pt presents with LUE weakness and  coordination deficits. Pt was referred to occupational therapy for evaluation and treatment by Dr. Shanon Brow Tat (hospitalist)    PRECAUTIONS: None   SUBJECTIVE: S: I can do more with this hand (left) and I don't have to think about it so hard. I can hold onto things better. I can drive again.    PAIN:  Are you having pain? No     OBJECTIVE:    FUNCTIONAL OUTCOME MEASURES: Quick Dash: 45.45   UPPER EXTREMITY ROM      Active ROM Left eval Left  01/08/22  Shoulder flexion [facility]  Shoulder abduction [facility]  Shoulder adduction [facility]  Shoulder extension [facility]  Shoulder internal rotation [facility]  Shoulder external rotation [facility]  Elbow flexion [facility] WFL  Elbow extension [facility] WFL  Wrist flexion 50 83  Wrist extension 38 49  Wrist ulnar deviation 12 15  Wrist radial deviation 20 18  Wrist pronation 90 90  Wrist supination 70 80  (Blank rows = not tested)     UPPER EXTREMITY MMT:      MMT Left eval Left  01/08/22  Shoulder flexion 4+/5 5/5  Shoulder abduction 4+/5 4+/5  Shoulder internal rotation 4+/5 5/5  Shoulder external rotation 4-/5 4+/5  Elbow flexion 4/5  5/5  Elbow extension 4/5 5/5  Wrist flexion 4+/5 5/5  Wrist extension 4/5 4+/5  Wrist ulnar deviation 4/5 4+/5  Wrist radial deviation 4+/5 5/5  Wrist pronation 5/5 5/5  Wrist supination 4/5 4+/5  (Blank rows = not tested)   HAND FUNCTION: Grip strength: Right: 100 lbs; Left: 31 lbs, Lateral pinch: Right: 23 lbs, Left: 15 lbs, and 3 point pinch: Right: 16 lbs, Left: 4 lbs 01/08/22: (Left) Grip strength: Left 60 lbs, Lateral pinch: 23 lbs, 3 point pinch 14 lbs   COORDINATION: 9 Hole Peg test: Right: 21.55" sec; Left: 1'09" sec 01/08/22: 40"       TODAY'S TREATMENT: 01/14/22 -Shoulder Strengthening: shoulder press, shoulder flexion, shoulder abduction, horizontal abduction, protraction 3#, 1x10 -Coordination: Pt seated at table, Y4/M2 opposition to flick sponge, 20 reps -Pinch Strength: Clothespin tower,  D4/5 pinch to place yellow and red, 3 point pinch for green and blue -Coordination: Grooved pegs, placing pegs with various pinch patterns  -Coordination: Small peg pattern board, palm to finger translation, x10  01/08/22 -Shoulder Strengthening: standing flexion, abduction, horizontal abduction, 2x10 reps, 3#. Rest break required with horizontal abduction -Fine Motor Coordination: 2x10, Pt picking up small bead with D1,4, and 5, completing supination/pronation, placing object. -Fine Motor Coordination: Medium bead in palm of hand, completing translation to move bead to each digit. 2 reps to each digit.  -Coordination task: Pt holding pennies in hand and translating to fingertips and placing in slotted container, mod to max difficulty achieving tip pinch and placing in container. Completed 4 trials. -Coordination task: picking up coins from tabletop-increased time to complete, coins sliding around at times, was able to pick up 5 coins without having to slide to end of table.      01/06/22 -Shoulder strengthening: Standing, flexion, 10 reps and horizontal abduction 6 reps, 3# -Arms on Fire: 2 x 30 seconds each movement with therapeutic break between each movement -Grip Strengthening: Hand gripper 42# to retrieve and place 15 pegs on  peg board.  -TheraPutty Grip Strengthening: Using digits 1-2 to retrieve beads from table top, finger-palm-finger, translation x10. Digits 1, 4, and 5 to pinch and pull TheraPutty, retrieving beads        PATIENT EDUCATION: Education details: Picking up small objects with fourth and fifth fingers Person educated: Patient Education method: Explanation, Demonstration, and Handouts Education comprehension: verbalized understanding and returned demonstration     HOME EXERCISE PROGRAM: Eval: shoulder A/ROM, red theraputty; 6/13-add weight to A/ROM exercises     GOALS: Goals reviewed with patient? Yes   SHORT TERM GOALS: Target date: 01/13/22   Pt will be  educated on HEP to improve LUE functional use during ADLs and work tasks.    Goal status: MET    2.  Pt will increase left grip strength to 40# and pinch strength by 3# to improve ability to grasp and maintain hold on items during meal preparation.    Goal status: MET   3.  Pt will increase left hand coordination required for tying shoes by completing 9 hole peg test in under 50".    Goal status: MET       LONG TERM GOALS: Target date: 02/03/22   Pt will return to highest functional level using LUE as non-dominant during ADLs and work tasks.    Goal status: Ongoing   2.  Pt will increase strength in LUE to 5/5 throughout to improve ability to perform lifting tasks at work.    Goal status: Partially Met  3.  Pt will increase left grip strength to 50# and pinch strength by 6# to improve ability to grasp and pull hoses and equipment at work.    Goal status: MET   4.  Pt will increase left hand coordination required for dressing tasks such as donning socks and belt, by completing 9 hole peg test in under 30".   Goal status: Ongoing     ASSESSMENT:   CLINICAL IMPRESSION: Strength improvements noted with shoulder exercises, pt able to complete more repetitions with no rest breaks and lower rated fatigue. Initially required table top as compensation to position D1/D5 in opposition, however, following P/ROM, pt able to achieve opposition independently. Pt instructed to use various pinch patterns to pick up, place, and remove grooved pegs, continues to have most difficulty with D1/5 pinch. Improvements noted with translation today, pt with increased fluidity and isolation, able to manipulate smaller objects with decreased time.        PLAN: OT FREQUENCY: 2x/week   OT DURATION: 6 weeks   PLANNED INTERVENTIONS: self care/ADL training, therapeutic exercise, therapeutic activity, neuromuscular re-education, splinting, electrical stimulation, patient/family education, and DME and/or AE  instructions     CONSULTED AND AGREED WITH PLAN OF CARE: Patient   PLAN FOR NEXT SESSION: continue with LUE strengthening-add ball on wall and progress proximal shoulder strengthening, continue with hand gripper activities, add pinch task with clothespins & sponges, fine motor coordination activities         Mathews Robinsons, OTR/L [phone]  01/14/2022, 1:43 PM  [place], [place], [address] Phone: [phone]   Fax:  [phone]

## 2022-01-21 ENCOUNTER — Ambulatory Visit (HOSPITAL_COMMUNITY): Payer: BC Managed Care – PPO

## 2022-01-23 ENCOUNTER — Encounter (HOSPITAL_COMMUNITY): Payer: Self-pay | Admitting: Occupational Therapy

## 2022-01-23 ENCOUNTER — Ambulatory Visit (HOSPITAL_COMMUNITY): Payer: BC Managed Care – PPO | Attending: Internal Medicine | Admitting: Occupational Therapy

## 2022-01-23 DIAGNOSIS — R278 Other lack of coordination: Secondary | ICD-10-CM | POA: Insufficient documentation

## 2022-01-23 DIAGNOSIS — R29818 Other symptoms and signs involving the nervous system: Secondary | ICD-10-CM | POA: Insufficient documentation

## 2022-01-23 NOTE — Therapy (Signed)
[place], [place], [address] Phone: [phone]   Fax:  [phone]  Patient Details  Name: Cameron [name] MRN: [id number] Date of Birth: 02/01/1966 Referring Provider:  Redmond School, MD  Encounter Date: 01/23/2022    OUTPATIENT OCCUPATIONAL THERAPY REASSESSMENT, TREATMENT NOTE DISCHARGE SUMMARY     Patient Name: Cameron [name] MRN: [id number] DOB:21-Jun-1966, 56 y.o., male Today's Date: 01/08/2022   PCP: Dr. Redmond School REFERRING PROVIDER: Dr. Shanon Brow Tat (hospitalist); Send Cert to Dr. Josephine Cables office   END OF SESSION:    OT End of Session      Visit Number 6    Number of Visits 12     Date for OT Re-Evaluation 02/03/22   mini-reassessment on 01/19/22    Authorization Type [employer]     Authorization Time Period no visit limit     OT Start Time 0900    OT Stop Time 0936    OT Time Calculation (min) 36 mins    Activity Tolerance Patient tolerated treatment well     Behavior During Therapy WFL for tasks assessed/performed                         Past Medical History:  Diagnosis Date   Chronic shoulder pain     Hypertension      History reviewed. No pertinent surgical history.     Patient Active Problem List    Diagnosis Date Noted   HTN (hypertension) 12/08/2021   Acute ischemic stroke ([place]) 12/08/2021   Uncontrolled type 2 diabetes mellitus with hyperglycemia, without long-term current use of insulin ([place]) 12/08/2021   Hyperlipidemia 12/08/2021   Tobacco abuse 12/08/2021   Alcohol use 12/08/2021   Cervical spine degeneration 12/08/2021   Hyponatremia 12/08/2021      ONSET DATE: 12/09/21   REFERRING DIAG: s/p CVA   THERAPY DIAG:  Other lack of coordination   Other symptoms and signs involving the nervous system   Rationale for Evaluation and Treatment Rehabilitation   PERTINENT HISTORY: Pt is a 56 y/o male s/p right CVA, presenting to hospital on 12/09/21 approximately 5 days after  symptom onset. Pt presents with LUE weakness and coordination deficits. Pt was referred to occupational therapy for evaluation and treatment by Dr. Shanon Brow Tat (hospitalist)    PRECAUTIONS: None   SUBJECTIVE: S: I can tie my shoes and everything now, by myself.    PAIN:  Are you having pain? No     OBJECTIVE:    FUNCTIONAL OUTCOME MEASURES: Quick Dash: 2.27 (45.45 previous)   UPPER EXTREMITY ROM      Active ROM Left eval Left  01/08/22 Left 01/23/22  Shoulder flexion [facility]   Shoulder abduction [facility]   Shoulder adduction [facility]   Shoulder extension [facility]   Shoulder internal rotation [facility]   Shoulder external rotation [facility] WFL   Elbow flexion [facility] WFL   Elbow extension [facility] WFL   Wrist flexion 50 83 70  Wrist extension 38 49 70  Wrist ulnar deviation '12 15 30  ' Wrist radial deviation '20 18 22  ' Wrist pronation 90 90 90  Wrist supination 70 80 90  (Blank rows = not tested)     UPPER EXTREMITY MMT:      MMT Left eval Left  01/08/22 Left 01/23/22  Shoulder flexion 4+/5 5/5 5/5  Shoulder abduction 4+/5 4+/5 5/5  Shoulder internal rotation 4+/5 5/5 5/5  Shoulder external  rotation 4-/5 4+/5 4+/5  Elbow flexion 4/5 5/5 5/5  Elbow extension 4/5 5/5 5/5  Wrist flexion 4+/5 5/5 5/5  Wrist extension 4/5 4+/5 5/5  Wrist ulnar deviation 4/5 4+/5 5/5  Wrist radial deviation 4+/5 5/5 5/5  Wrist pronation 5/5 5/5 5/5  Wrist supination 4/5 4+/5 5/5  (Blank rows = not tested)   HAND FUNCTION: Grip strength: Right: 100 lbs; Left: 31 lbs, Lateral pinch: Right: 23 lbs, Left: 15 lbs, and 3 point pinch: Right: 16 lbs, Left: 4 lbs 01/08/22: (Left) Grip strength: Left 60 lbs, Lateral pinch: 23 lbs, 3 point pinch 14 lbs 01/23/22: (Left) Grip strength: Left 80 lbs, lateral pinch: 23 lbs, 3 point 15 lbs   COORDINATION: 9 Hole Peg test: Right: 21.55" sec; Left: 1'09" sec 01/08/22: 40" 01/23/22: 31.14"       TODAY'S TREATMENT: 01/23/22: -Coordination task: pt holdling 5 grooved pegs in  palm working on palm to fingertip translation to place pegs in keyed pegboard. Pt able to complete with slightly increased time, did not drop any pegs. When removing from board, pt taking out one at a time and moving to palm, was able to hold all pegs, dropped one peg during task. -Cards: Pt working on rotating cards 5x then flipping cards 5x. Completed 5 cards. -Card Shuffle: pt shuffling cards, splitting deck and shuffling together-mod difficulty with splitting deck on first trial, improved with practice -Grip strengthening: all large beads hand gripper at 45#, vertical; all medium beads hand gripper at 42#   01/14/22 -Shoulder Strengthening: shoulder press, shoulder flexion, shoulder abduction, horizontal abduction, protraction 3#, 1x10 -Coordination: Pt seated at table, X3/K4 opposition to flick sponge, 20 reps -Pinch Strength: Clothespin tower, D4/5 pinch to place yellow and red, 3 point pinch for green and blue -Coordination: Grooved pegs, placing pegs with various pinch patterns  -Coordination: Small peg pattern board, palm to finger translation, x10   01/08/22 -Shoulder Strengthening: standing flexion, abduction, horizontal abduction, 2x10 reps, 3#. Rest break required with horizontal abduction -Fine Motor Coordination: 2x10, Pt picking up small bead with D1,4, and 5, completing supination/pronation, placing object. -Fine Motor Coordination: Medium bead in palm of hand, completing translation to move bead to each digit. 2 reps to each digit.  -Coordination task: Pt holding pennies in hand and translating to fingertips and placing in slotted container, mod to max difficulty achieving tip pinch and placing in container. Completed 4 trials. -Coordination task: picking up coins from tabletop-increased time to complete, coins sliding around at times, was able to pick up 5 coins without having to slide to end of table.           PATIENT EDUCATION: Education details: Continue with fine motor  tasks Person educated: Patient Education method: Explanation, Demonstration, and Handouts Education comprehension: verbalized understanding and returned demonstration     HOME EXERCISE PROGRAM: Eval: shoulder A/ROM, red theraputty; 6/13-add weight to A/ROM exercises; 7/7: fine motor exercise list     GOALS: Goals reviewed with patient? Yes   SHORT TERM GOALS: Target date: 01/13/22   Pt will be educated on HEP to improve LUE functional use during ADLs and work tasks.    Goal status: MET    2.  Pt will increase left grip strength to 40# and pinch strength by 3# to improve ability to grasp and maintain hold on items during meal preparation.    Goal status: MET   3.  Pt will increase left hand coordination required for tying shoes by completing 9 hole peg  test in under 50".    Goal status: MET       LONG TERM GOALS: Target date: 02/03/22   Pt will return to highest functional level using LUE as non-dominant during ADLs and work tasks.    Goal status: MET   2.  Pt will increase strength in LUE to 5/5 throughout to improve ability to perform lifting tasks at work.    Goal status: MET   3.  Pt will increase left grip strength to 50# and pinch strength by 6# to improve ability to grasp and pull hoses and equipment at work.    Goal status: MET   4.  Pt will increase left hand coordination required for dressing tasks such as donning socks and belt, by completing 9 hole peg test in under 30".   Goal status: Partially Met-31"     ASSESSMENT:   CLINICAL IMPRESSION: A: Pt reports he is using his LUE at home naturally, does not have to think about using it anymore. Pt reports he can tie his shoes, open items, using utensils bilaterally now. Mini-reassessment completed this session, pt has met all STGs, 3/4 LTGs with remaining goal partially met. Pt with good LUE strength and grip strength, coordination has significantly improved, continues to have slight coordination deficits when  completing tedious tasks. Pt completing grip strengthening and coordination tasks today, slightly increased time for coordination activities. Pt is agreeable to discharge today with HEP.         PLAN: OT FREQUENCY: 2x/week   OT DURATION: 6 weeks   PLANNED INTERVENTIONS: self care/ADL training, therapeutic exercise, therapeutic activity, neuromuscular re-education, splinting, electrical stimulation, patient/family education, and DME and/or AE instructions     CONSULTED AND AGREED WITH PLAN OF CARE: Patient   PLAN FOR NEXT SESSION: N/A-Discharge     OCCUPATIONAL THERAPY DISCHARGE SUMMARY  Visits from Start of Care: 6  Current functional level related to goals / functional outcomes: See above. Pt has met 6/7 goals, partially met remaining goal. Pt is using LUE as non-dominant during ADL tasks, reports without having to think about motor planning now. Good strength and grip strength, significantly improved coordination.   Remaining deficits: Mild coordination deficits   Education / Equipment: HEP   Patient agrees to discharge. Patient goals were met. Patient is being discharged due to meeting the stated rehab goals.Guadelupe Sabin, OTR/L  [phone]  01/23/2022, 8:58 AM  [facility] [address] [place], [place], [address] Phone: [phone]   Fax:  [phone]

## 2022-01-26 ENCOUNTER — Encounter: Payer: BC Managed Care – PPO | Admitting: Nutrition

## 2022-01-26 ENCOUNTER — Telehealth: Payer: Self-pay | Admitting: Nutrition

## 2022-01-26 NOTE — Telephone Encounter (Signed)
TC to patient. He couldn't sign into computer. Asked to r/s appt.  Reviewed times of meals, his medications over phone. He agreed to test before breakfast and bedtime til next week's appt. His sister is checking his blood sugars. He has what appears to an accucheck guide meter. He verbalzed understandng to bring meter and BS reading to his appointment.

## 2022-01-27 ENCOUNTER — Encounter (HOSPITAL_COMMUNITY): Payer: BC Managed Care – PPO

## 2022-01-29 ENCOUNTER — Encounter (HOSPITAL_COMMUNITY): Payer: BC Managed Care – PPO | Admitting: Occupational Therapy

## 2022-02-02 DIAGNOSIS — I10 Essential (primary) hypertension: Secondary | ICD-10-CM | POA: Diagnosis not present

## 2022-02-02 DIAGNOSIS — G8194 Hemiplegia, unspecified affecting left nondominant side: Secondary | ICD-10-CM | POA: Diagnosis not present

## 2022-02-02 DIAGNOSIS — E119 Type 2 diabetes mellitus without complications: Secondary | ICD-10-CM | POA: Diagnosis not present

## 2022-02-03 ENCOUNTER — Ambulatory Visit: Payer: BC Managed Care – PPO | Admitting: Nutrition

## 2022-02-03 ENCOUNTER — Encounter (HOSPITAL_COMMUNITY): Payer: BC Managed Care – PPO | Admitting: Occupational Therapy

## 2022-02-05 ENCOUNTER — Ambulatory Visit (HOSPITAL_COMMUNITY): Payer: BC Managed Care – PPO | Admitting: Occupational Therapy

## 2022-04-16 DIAGNOSIS — I10 Essential (primary) hypertension: Secondary | ICD-10-CM | POA: Diagnosis not present

## 2022-04-16 DIAGNOSIS — E119 Type 2 diabetes mellitus without complications: Secondary | ICD-10-CM | POA: Diagnosis not present

## 2022-04-16 DIAGNOSIS — F17200 Nicotine dependence, unspecified, uncomplicated: Secondary | ICD-10-CM | POA: Diagnosis not present

## 2022-07-10 DIAGNOSIS — I10 Essential (primary) hypertension: Secondary | ICD-10-CM | POA: Diagnosis not present

## 2022-07-10 DIAGNOSIS — F17210 Nicotine dependence, cigarettes, uncomplicated: Secondary | ICD-10-CM | POA: Diagnosis not present

## 2022-07-10 DIAGNOSIS — E119 Type 2 diabetes mellitus without complications: Secondary | ICD-10-CM | POA: Diagnosis not present

## 2022-08-09 IMAGING — DX DG SHOULDER 2+V*L*
3 series · 3 of 3 positions shown · non-contrast
Comparison: 11/16/2017

CLINICAL DATA: Acute LEFT shoulder pain today. No known injury.
Initial encounter.

EXAM:
LEFT SHOULDER - 2+ VIEW

[shoulder grashey]
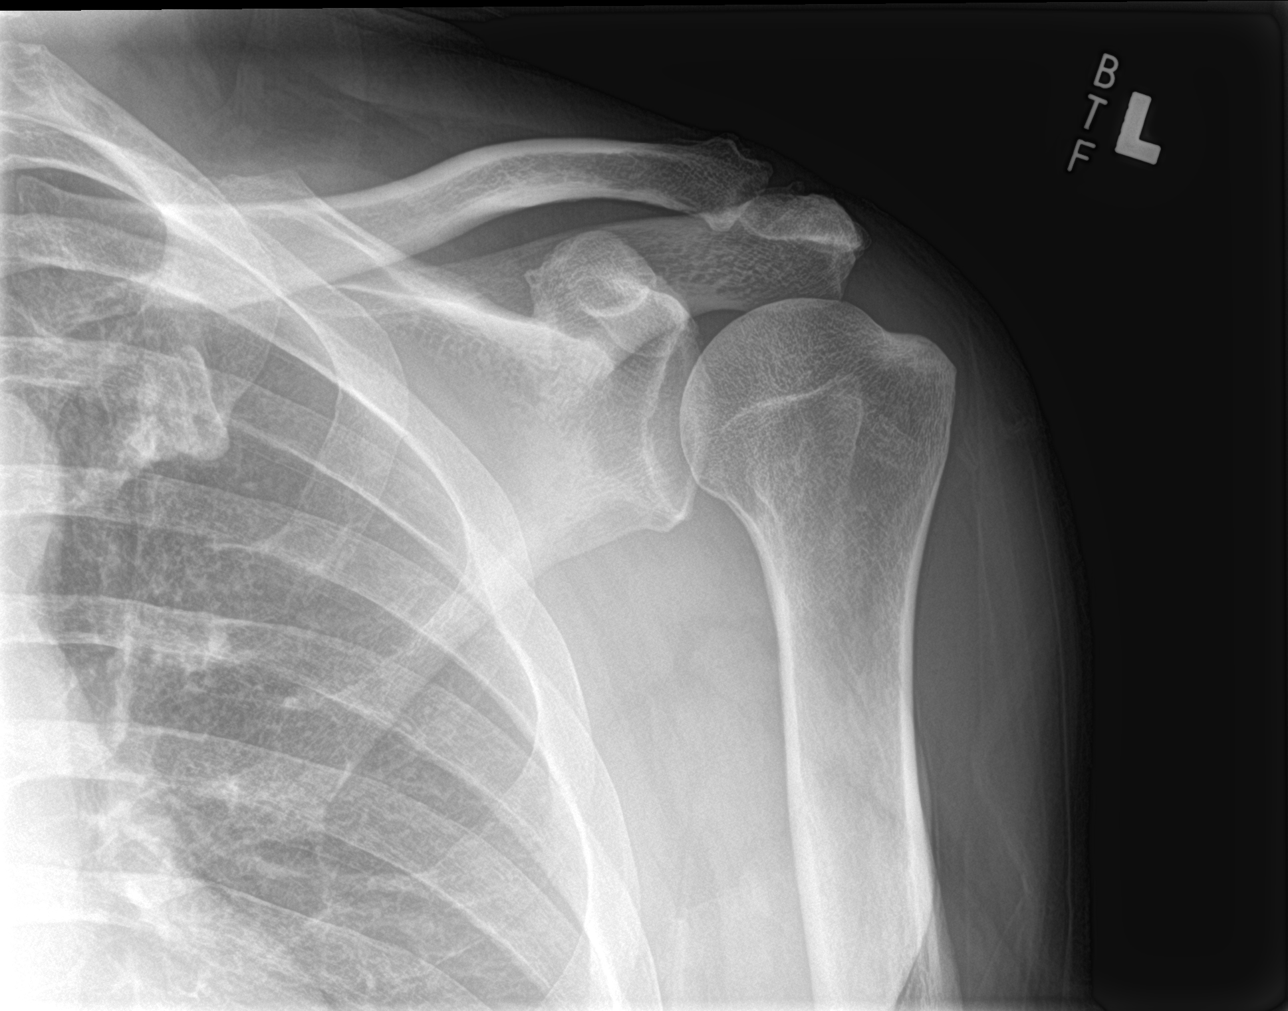

[shoulder y view (1 of 2)]
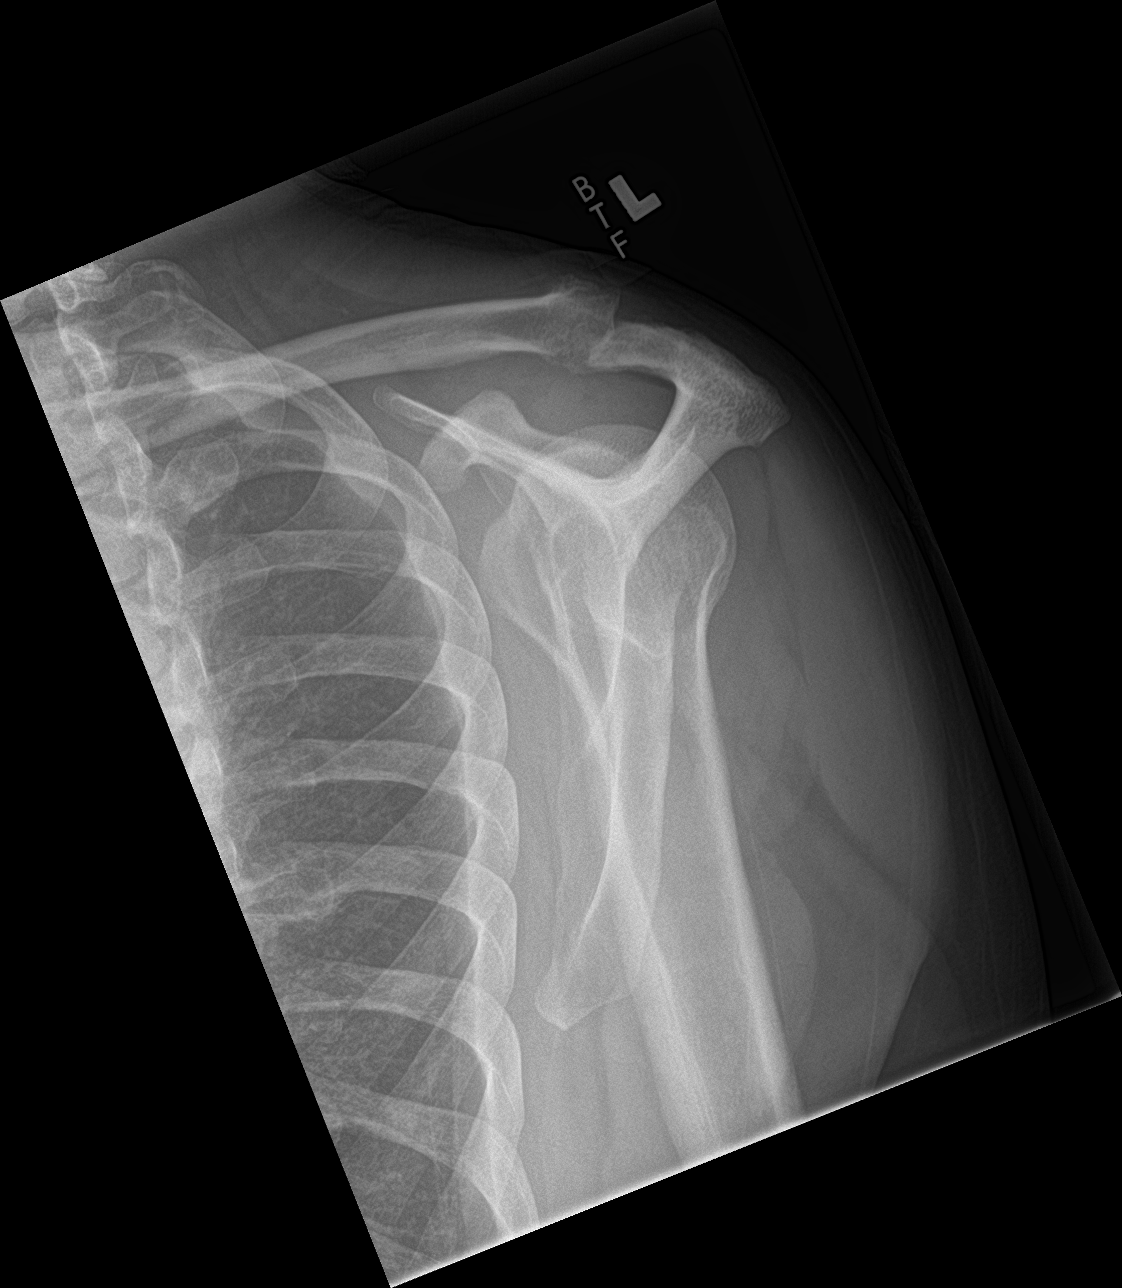

[shoulder y view (2 of 2)]
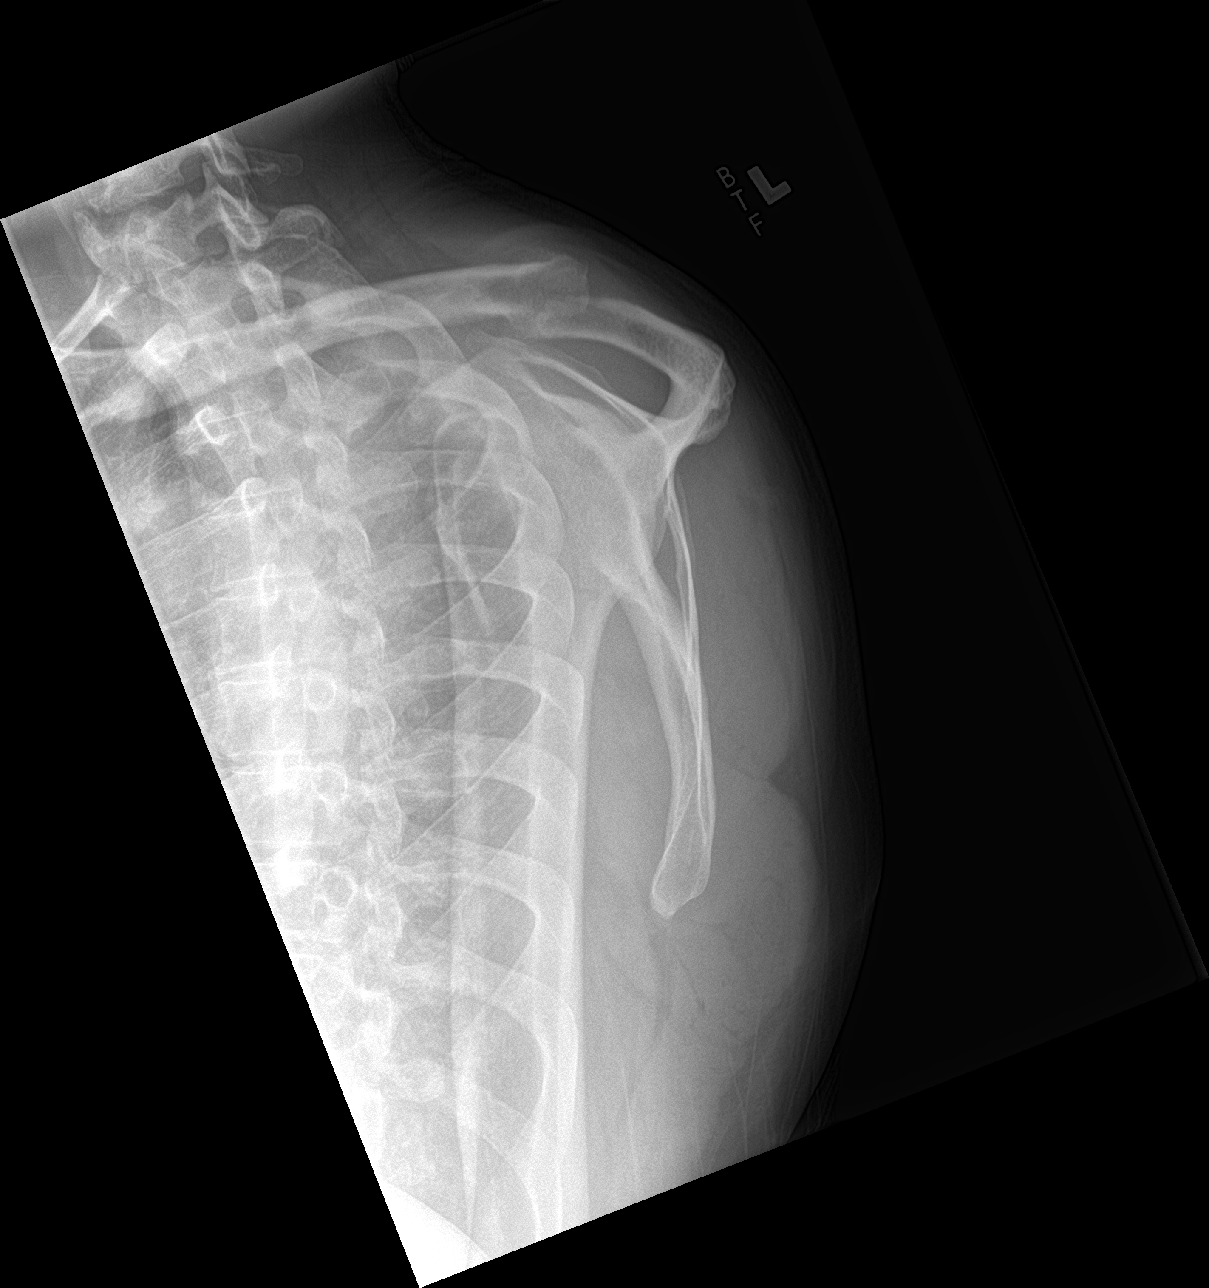

[3 of 3 positions shown; findings below may reference images not displayed]

FINDINGS: There is no evidence of fracture or dislocation. There is no
evidence of arthropathy or other focal bone abnormality. Soft
tissues are unremarkable.
IMPRESSION: Negative.

## 2022-08-09 IMAGING — MR MR HEAD W/O CM
11 of 13 series · 28 of 48 positions shown · non-contrast
Comparison: None Available.

CLINICAL DATA: Neuro deficit, acute, stroke suspected left arm
weakness

EXAM:
MRI HEAD WITHOUT CONTRAST
MRA HEAD WITHOUT CONTRAST
TECHNIQUE: Multiplanar, multi-echo pulse sequences of the brain and surrounding
structures were acquired without intravenous contrast. Angiographic
images of the Circle of Willis were acquired using MRA technique
without intravenous contrast.

[Series 5: DWI · axial · 4.0mm · 0.88mm/px · z∈[-63,+80]mm · 4 of 37 slices shown (1 of 6)]
[im 1/37]
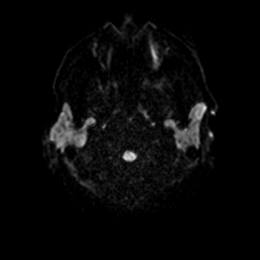
[im 13/37]
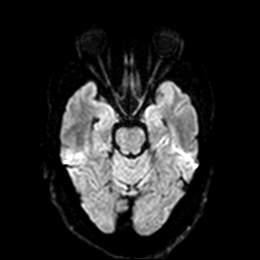
[im 25/37]
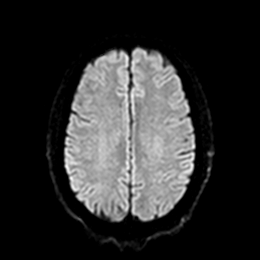
[im 37/37]
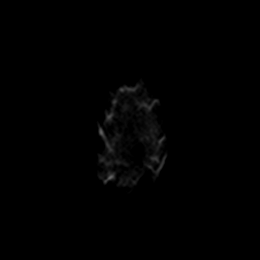

[Series 5: DWI · axial · 4.0mm · 0.88mm/px · z∈[-63,+80]mm · 4 of 37 slices shown (2 of 6)]
[im 1/37]
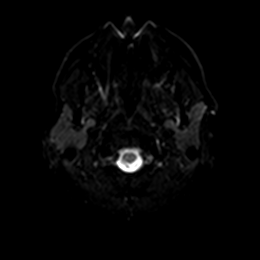
[im 13/37]
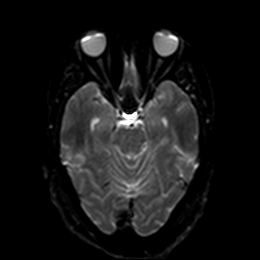
[im 25/37]
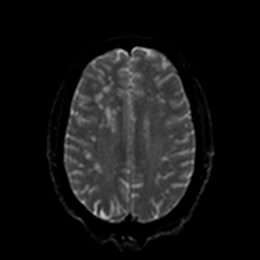
[im 37/37]
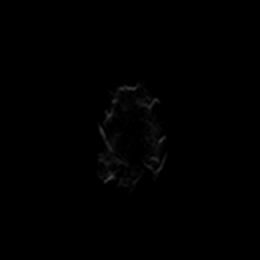

[Series 6: DWI · axial · 4.0mm · 0.88mm/px · z∈[-63,+80]mm · 3 of 37 slices shown (3 of 6)]
[im 1/37]
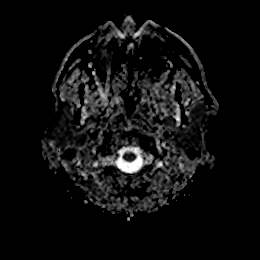
[im 19/37]
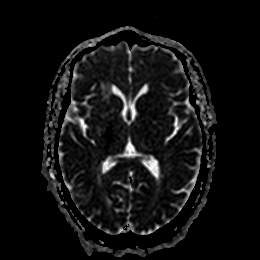
[im 37/37]
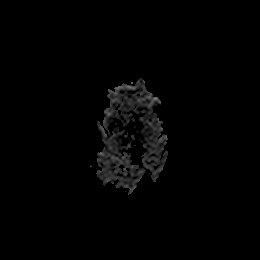

[Series 7: DWI · coronal · 5.0mm · 0.88mm/px · 2 of 29 slices shown (4 of 6)]
[im 1/29]
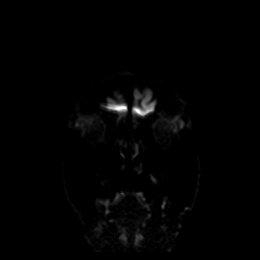
[im 29/29]
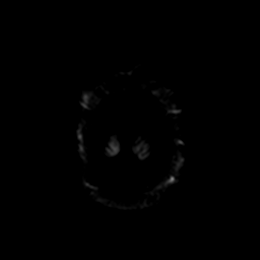

[Series 7: DWI · coronal · 5.0mm · 0.88mm/px · 2 of 29 slices shown (5 of 6)]
[im 1/29]
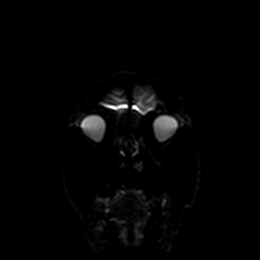
[im 29/29]
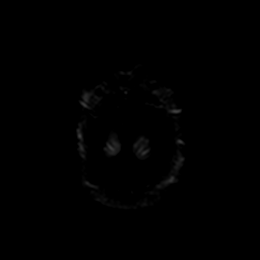

[Series 8: DWI · coronal · 5.0mm · 0.88mm/px · 2 of 29 slices shown (6 of 6)]
[im 1/29]
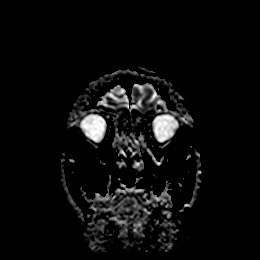
[im 29/29]
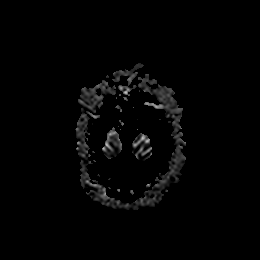

[Series 9: T1 · sagittal · 5.0mm · 0.94mm/px · 2 of 21 slices shown]
[im 1/21]
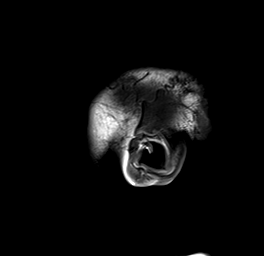
[im 21/21]
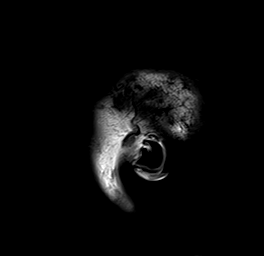

[Series 10: T2 · axial · 5.0mm · 0.72mm/px · z∈[-65,+82]mm · 2 of 22 slices shown (1 of 2)]
[im 1/22]
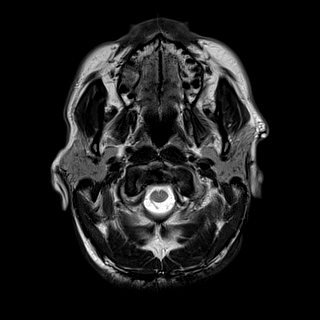
[im 22/22]
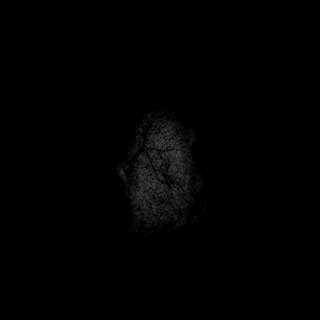

[Series 16: ax hemo · axial · 5.0mm · 0.86mm/px · z∈[-63,+81]mm · 2 of 25 slices shown]
[im 1/25]
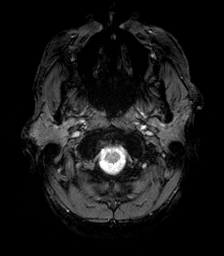
[im 25/25]
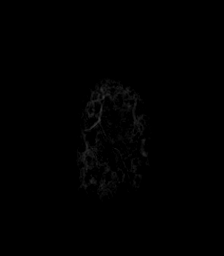

[Series 17: FLAIR · axial · 4.0mm · 0.43mm/px · z∈[-69,+87]mm · 3 of 40 slices shown]
[im 1/40]
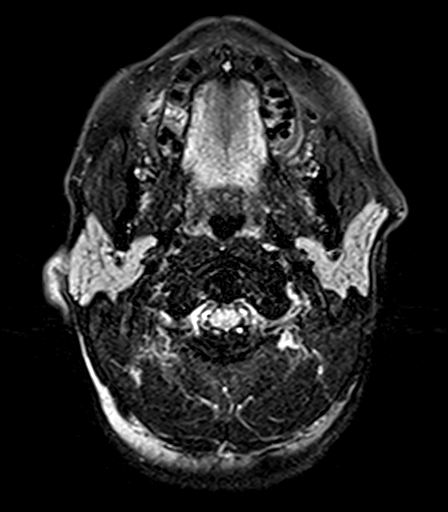
[im 20/40]
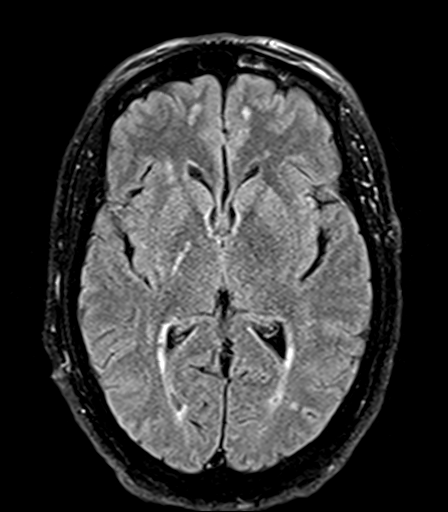
[im 40/40]
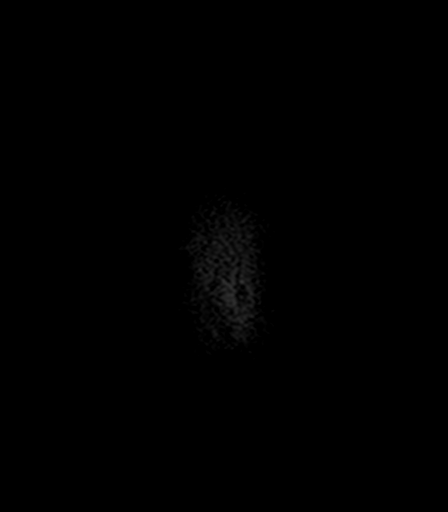

[Series 19: T2 · coronal · 5.0mm · 0.72mm/px · 2 of 28 slices shown (2 of 2)]
[im 1/28]
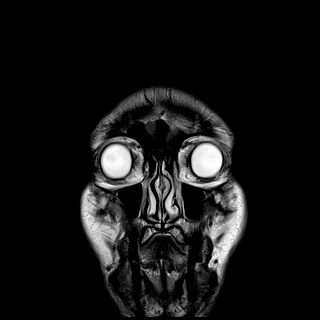
[im 28/28]
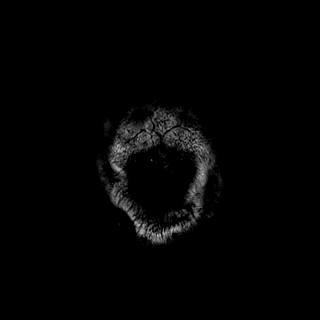

[28 of 48 positions shown; findings below may reference images not displayed]

FINDINGS: MRI HEAD

Brain: 1.6 cm focus of restricted diffusion with involvement of the
posterior limb of the internal capsule.

Ventricles and sulci are normal in size and configuration. Patchy
and confluent areas of T2 hyperintensity in the supratentorial and
pontine white matter are nonspecific but may reflect mild to
moderate chronic microvascular ischemic changes. There are
additional chronic small vessel infarcts of the right basal ganglia
and adjacent white matter. No intracranial mass or mass effect. No
hydrocephalus or extra-axial collection.

Vascular: Major vessel flow voids at the skull base are preserved.

Skull and upper cervical spine: Normal marrow signal is preserved.
Cervical spine degenerative changes.

Sinuses/Orbits: Paranasal sinuses are aerated. Orbits are
unremarkable.

Other: Sella is unremarkable.  Mastoid air cells are clear.

MRA HEAD

Intracranial internal carotid arteries are patent with
atherosclerotic irregularity. Middle and anterior cerebral arteries
are patent. Intracranial vertebral arteries, basilar artery,
posterior cerebral arteries are patent. Bilateral posterior
communicating arteries are present. There is no significant stenosis
or aneurysm.
IMPRESSION: Small acute infarct with involvement of the posterior limb of the
right internal capsule.

Mild to moderate chronic microvascular ischemic changes. Additional
chronic small vessel infarcts of the right basal ganglia and
adjacent white matter.

No proximal intracranial vessel occlusion or significant stenosis.

## 2023-01-04 ENCOUNTER — Encounter (HOSPITAL_COMMUNITY): Payer: Self-pay | Admitting: Emergency Medicine

## 2023-01-04 ENCOUNTER — Inpatient Hospital Stay (HOSPITAL_COMMUNITY)
Admission: EM | Admit: 2023-01-04 | Discharge: 2023-01-06 | DRG: 066 | Discharge status: Against Medical Advice | Payer: 59 | Attending: Family Medicine | Admitting: Family Medicine

## 2023-01-04 ENCOUNTER — Emergency Department (HOSPITAL_COMMUNITY): Payer: 59

## 2023-01-04 ENCOUNTER — Other Ambulatory Visit: Payer: Self-pay

## 2023-01-04 DIAGNOSIS — F1210 Cannabis abuse, uncomplicated: Secondary | ICD-10-CM | POA: Diagnosis present

## 2023-01-04 DIAGNOSIS — F1410 Cocaine abuse, uncomplicated: Secondary | ICD-10-CM | POA: Diagnosis present

## 2023-01-04 DIAGNOSIS — T464X6A Underdosing of angiotensin-converting-enzyme inhibitors, initial encounter: Secondary | ICD-10-CM | POA: Diagnosis present

## 2023-01-04 DIAGNOSIS — E119 Type 2 diabetes mellitus without complications: Secondary | ICD-10-CM | POA: Diagnosis present

## 2023-01-04 DIAGNOSIS — T466X6A Underdosing of antihyperlipidemic and antiarteriosclerotic drugs, initial encounter: Secondary | ICD-10-CM | POA: Diagnosis present

## 2023-01-04 DIAGNOSIS — R4781 Slurred speech: Secondary | ICD-10-CM | POA: Diagnosis not present

## 2023-01-04 DIAGNOSIS — I639 Cerebral infarction, unspecified: Secondary | ICD-10-CM | POA: Diagnosis not present

## 2023-01-04 DIAGNOSIS — F17210 Nicotine dependence, cigarettes, uncomplicated: Secondary | ICD-10-CM | POA: Diagnosis present

## 2023-01-04 DIAGNOSIS — I69334 Monoplegia of upper limb following cerebral infarction affecting left non-dominant side: Secondary | ICD-10-CM

## 2023-01-04 DIAGNOSIS — I63412 Cerebral infarction due to embolism of left middle cerebral artery: Secondary | ICD-10-CM | POA: Diagnosis not present

## 2023-01-04 DIAGNOSIS — R81 Glycosuria: Secondary | ICD-10-CM | POA: Diagnosis present

## 2023-01-04 DIAGNOSIS — I10 Essential (primary) hypertension: Secondary | ICD-10-CM | POA: Diagnosis present

## 2023-01-04 DIAGNOSIS — R4701 Aphasia: Secondary | ICD-10-CM | POA: Diagnosis present

## 2023-01-04 DIAGNOSIS — R29704 NIHSS score 4: Secondary | ICD-10-CM | POA: Diagnosis present

## 2023-01-04 DIAGNOSIS — Z7982 Long term (current) use of aspirin: Secondary | ICD-10-CM

## 2023-01-04 DIAGNOSIS — Z7984 Long term (current) use of oral hypoglycemic drugs: Secondary | ICD-10-CM

## 2023-01-04 DIAGNOSIS — T45526A Underdosing of antithrombotic drugs, initial encounter: Secondary | ICD-10-CM | POA: Diagnosis present

## 2023-01-04 DIAGNOSIS — Z5329 Procedure and treatment not carried out because of patient's decision for other reasons: Secondary | ICD-10-CM | POA: Diagnosis not present

## 2023-01-04 DIAGNOSIS — Z7902 Long term (current) use of antithrombotics/antiplatelets: Secondary | ICD-10-CM

## 2023-01-04 DIAGNOSIS — F1010 Alcohol abuse, uncomplicated: Secondary | ICD-10-CM | POA: Diagnosis present

## 2023-01-04 DIAGNOSIS — Y902 Blood alcohol level of 40-59 mg/100 ml: Secondary | ICD-10-CM | POA: Diagnosis present

## 2023-01-04 DIAGNOSIS — E785 Hyperlipidemia, unspecified: Secondary | ICD-10-CM | POA: Diagnosis present

## 2023-01-04 DIAGNOSIS — R4182 Altered mental status, unspecified: Secondary | ICD-10-CM | POA: Diagnosis not present

## 2023-01-04 DIAGNOSIS — Z79899 Other long term (current) drug therapy: Secondary | ICD-10-CM

## 2023-01-04 DIAGNOSIS — T383X6A Underdosing of insulin and oral hypoglycemic [antidiabetic] drugs, initial encounter: Secondary | ICD-10-CM | POA: Diagnosis present

## 2023-01-04 DIAGNOSIS — Z91128 Patient's intentional underdosing of medication regimen for other reason: Secondary | ICD-10-CM

## 2023-01-04 DIAGNOSIS — G8194 Hemiplegia, unspecified affecting left nondominant side: Secondary | ICD-10-CM | POA: Diagnosis present

## 2023-01-04 HISTORY — DX: Cerebral infarction, unspecified: I63.9

## 2023-01-04 LAB — CBC WITH DIFFERENTIAL/PLATELET
MCHC: 32.5 g/dL (ref 30.0–36.0)
RDW: 12.8 % (ref 11.5–15.5)

## 2023-01-04 NOTE — ED Triage Notes (Addendum)
Pt via POV c/o AMS x 4-5 days with prior hx strokes. Pt says he "just doesn't feel right." Gait abnormality noted while pt ambulated to triage. No facial droop noted. Pt has some slight slurred speech. Pt drinks ETOH and used cocaine and THC. Pt admits medication noncompliance. He seems quite confused in triage. EDP notified and present in triage to assess pt.

## 2023-01-05 ENCOUNTER — Telehealth: Payer: Self-pay | Admitting: *Deleted

## 2023-01-05 ENCOUNTER — Observation Stay (HOSPITAL_COMMUNITY): Payer: 59

## 2023-01-05 ENCOUNTER — Encounter (HOSPITAL_COMMUNITY): Payer: Self-pay

## 2023-01-05 DIAGNOSIS — I639 Cerebral infarction, unspecified: Secondary | ICD-10-CM | POA: Diagnosis not present

## 2023-01-05 DIAGNOSIS — R4781 Slurred speech: Secondary | ICD-10-CM | POA: Diagnosis not present

## 2023-01-05 DIAGNOSIS — I63512 Cerebral infarction due to unspecified occlusion or stenosis of left middle cerebral artery: Secondary | ICD-10-CM | POA: Diagnosis not present

## 2023-01-05 DIAGNOSIS — I63233 Cerebral infarction due to unspecified occlusion or stenosis of bilateral carotid arteries: Secondary | ICD-10-CM | POA: Diagnosis not present

## 2023-01-05 LAB — COMPREHENSIVE METABOLIC PANEL
ALT: 20 U/L (ref 0–44)
AST: 25 U/L (ref 15–41)
Albumin: 4.2 g/dL (ref 3.5–5.0)
Alkaline Phosphatase: 67 U/L (ref 38–126)
Anion gap: 10 (ref 5–15)
BUN: 9 mg/dL (ref 6–20)
CO2: 22 mmol/L (ref 22–32)
Calcium: 9.2 mg/dL (ref 8.9–10.3)
Chloride: 98 mmol/L (ref 98–111)
Creatinine, Ser: 0.77 mg/dL (ref 0.61–1.24)
GFR, Estimated: >60 mL/min (ref >60)
Glucose, Bld: 235 mg/dL — ABNORMAL HIGH (ref 70–99)
Potassium: 3.7 mmol/L (ref 3.5–5.1)
Sodium: 130 mmol/L — ABNORMAL LOW (ref 135–145)
Total Bilirubin: 0.7 mg/dL (ref 0.3–1.2)
Total Protein: 8.8 g/dL — ABNORMAL HIGH (ref 6.5–8.1)

## 2023-01-05 LAB — CBC WITH DIFFERENTIAL/PLATELET
Basophils Absolute: 0 10*3/uL (ref 0.0–0.1)
Eosinophils Absolute: 0 10*3/uL (ref 0.0–0.5)
HCT: 46.5 % (ref 39.0–52.0)
Hemoglobin: 15.1 g/dL (ref 13.0–17.0)
Immature Granulocytes: 0 %
Lymphs Abs: 1.5 10*3/uL (ref 0.7–4.0)
MCH: 27.5 pg (ref 26.0–34.0)
MCV: 84.7 fL (ref 80.0–100.0)
Monocytes Relative: 5 %
Neutro Abs: 10.3 10*3/uL — ABNORMAL HIGH (ref 1.7–7.7)
Platelets: 217 10*3/uL (ref 150–400)
RBC: 5.49 MIL/uL (ref 4.22–5.81)
WBC: 12.5 10*3/uL — ABNORMAL HIGH (ref 4.0–10.5)
nRBC: 0 % (ref 0.0–0.2)

## 2023-01-05 LAB — URINALYSIS, ROUTINE W REFLEX MICROSCOPIC
Bacteria, UA: NONE SEEN
Bilirubin Urine: NEGATIVE
Glucose, UA: >=500 mg/dL — AB
Hgb urine dipstick: NEGATIVE
Ketones, ur: NEGATIVE mg/dL
Leukocytes,Ua: NEGATIVE
Nitrite: NEGATIVE
Protein, ur: NEGATIVE mg/dL
Specific Gravity, Urine: 1.005 (ref 1.005–1.030)
pH: 5 (ref 5.0–8.0)

## 2023-01-05 LAB — GLUCOSE, CAPILLARY
Glucose-Capillary: 132 mg/dL — ABNORMAL HIGH (ref 70–99)
Glucose-Capillary: 199 mg/dL — ABNORMAL HIGH (ref 70–99)
Glucose-Capillary: 126 mg/dL — ABNORMAL HIGH (ref 70–99)
Glucose-Capillary: 158 mg/dL — ABNORMAL HIGH (ref 70–99)

## 2023-01-05 LAB — HEMOGLOBIN A1C
Hgb A1c MFr Bld: 5.9 % — ABNORMAL HIGH (ref 4.8–5.6)
Mean Plasma Glucose: 122.63 mg/dL

## 2023-01-05 LAB — RAPID URINE DRUG SCREEN, HOSP PERFORMED
Amphetamines: NOT DETECTED
Barbiturates: NOT DETECTED
Benzodiazepines: NOT DETECTED
Cocaine: POSITIVE — AB
Opiates: NOT DETECTED
Tetrahydrocannabinol: POSITIVE — AB

## 2023-01-05 LAB — ETHANOL: Alcohol, Ethyl (B): 49 mg/dL — ABNORMAL HIGH (ref <10)

## 2023-01-05 LAB — PROTIME-INR
INR: 1.1 (ref 0.8–1.2)
Prothrombin Time: 14.3 seconds (ref 11.4–15.2)

## 2023-01-05 LAB — TROPONIN I (HIGH SENSITIVITY)
Troponin I (High Sensitivity): 4 ng/L (ref <18)
Troponin I (High Sensitivity): 4 ng/L (ref <18)

## 2023-01-05 LAB — APTT: aPTT: 28 seconds (ref 24–36)

## 2023-01-05 MED ORDER — ASPIRIN 81 MG PO TBEC
81.0000 mg | DELAYED_RELEASE_TABLET | Freq: Every day | ORAL | Status: DC
  Administered 2023-01-05 – 2023-01-06 (×2): 81 mg via ORAL
  Filled 2023-01-05 (×2): qty 1

## 2023-01-05 MED ORDER — ACETAMINOPHEN 160 MG/5ML PO SOLN: 650.0000 mg | ORAL | Status: DC | PRN

## 2023-01-05 MED ORDER — ATORVASTATIN CALCIUM 40 MG PO TABS
80.0000 mg | ORAL_TABLET | Freq: Every day | ORAL | Status: DC
  Administered 2023-01-05 – 2023-01-06 (×2): 80 mg via ORAL
  Filled 2023-01-05 (×2): qty 2

## 2023-01-05 MED ORDER — INSULIN ASPART 100 UNIT/ML IJ SOLN: 0.0000 [IU] | Freq: Every day | INTRAMUSCULAR | Status: DC

## 2023-01-05 MED ORDER — ENOXAPARIN SODIUM 40 MG/0.4ML IJ SOSY
40.0000 mg | PREFILLED_SYRINGE | Freq: Every day | INTRAMUSCULAR | Status: DC
  Administered 2023-01-05 – 2023-01-06 (×2): 40 mg via SUBCUTANEOUS
  Filled 2023-01-05 (×2): qty 0.4

## 2023-01-05 MED ORDER — ENOXAPARIN SODIUM 40 MG/0.4ML IJ SOSY: 40.0000 mg | PREFILLED_SYRINGE | INTRAMUSCULAR | Status: DC

## 2023-01-05 MED ORDER — METFORMIN HCL 500 MG PO TABS
500.0000 mg | ORAL_TABLET | Freq: Two times a day (BID) | ORAL | Status: DC
  Administered 2023-01-05 – 2023-01-06 (×2): 500 mg via ORAL
  Filled 2023-01-05 (×2): qty 1

## 2023-01-05 MED ORDER — CLOPIDOGREL BISULFATE 75 MG PO TABS: 75.0000 mg | ORAL_TABLET | Freq: Every day | ORAL | Status: DC

## 2023-01-05 MED ORDER — STROKE: EARLY STAGES OF RECOVERY BOOK: Freq: Once | Status: AC

## 2023-01-05 MED ORDER — ACETAMINOPHEN 650 MG RE SUPP: 650.0000 mg | RECTAL | Status: DC | PRN

## 2023-01-05 MED ORDER — ACETAMINOPHEN 325 MG PO TABS: 650.0000 mg | ORAL_TABLET | ORAL | Status: DC | PRN

## 2023-01-05 MED ORDER — CLOPIDOGREL BISULFATE 75 MG PO TABS
75.0000 mg | ORAL_TABLET | Freq: Every day | ORAL | Status: DC
  Administered 2023-01-06: 75 mg via ORAL
  Filled 2023-01-05: qty 1

## 2023-01-05 MED ORDER — CLOPIDOGREL BISULFATE 75 MG PO TABS
75.0000 mg | ORAL_TABLET | Freq: Every day | ORAL | Status: DC
  Administered 2023-01-05: 75 mg via ORAL
  Filled 2023-01-05: qty 1

## 2023-01-05 MED ORDER — INSULIN ASPART 100 UNIT/ML IJ SOLN
0.0000 [IU] | Freq: Three times a day (TID) | INTRAMUSCULAR | Status: DC
  Administered 2023-01-05: 2 [IU] via SUBCUTANEOUS
  Administered 2023-01-05: 1 [IU] via SUBCUTANEOUS
  Administered 2023-01-06: 2 [IU] via SUBCUTANEOUS

## 2023-01-05 MED ORDER — CLOPIDOGREL BISULFATE 75 MG PO TABS: 300.0000 mg | ORAL_TABLET | Freq: Once | ORAL | Status: DC

## 2023-01-05 MED ORDER — GLIPIZIDE 5 MG PO TABS
5.0000 mg | ORAL_TABLET | Freq: Every day | ORAL | Status: DC
  Administered 2023-01-06: 5 mg via ORAL
  Filled 2023-01-05: qty 1

## 2023-01-05 NOTE — ED Provider Notes (Signed)
[place] EMERGENCY DEPARTMENT AT [facility]  Provider Note  CSN: [id number] Arrival date & time: 01/04/23 2146  History Chief Complaint  Patient presents with   Altered Mental Status    Cameron [name] is a 57 y.o. male with prior history of stroke with some residual L arm weakness brought to the ED by ex-wife for evaluation of new confusion, speech difficulty and ataxia. She does not live with him but reports she suspects symptoms began about 4-5 days ago because he got sent home from work early due to some conflict there and she spoke with his sister who tried to call him the next day and noted his speech to be abnormal. He is unable to give much additional history.    Home Medications Prior to Admission medications   Medication Sig Start Date End Date Taking? Authorizing Provider  aspirin 81 MG chewable tablet Chew 1 tablet (81 mg total) by mouth daily. 12/10/21   Catarina Hartshorn, MD  atorvastatin (LIPITOR) 20 MG tablet Take 1 tablet (20 mg total) by mouth daily. 12/10/21   Catarina Hartshorn, MD  blood glucose meter kit and supplies KIT Dispense based on patient and insurance preference. Use up to four times daily as directed. 12/09/21   Catarina Hartshorn, MD  clopidogrel (PLAVIX) 75 MG tablet Take 1 tablet (75 mg total) by mouth daily. 12/10/21   Catarina Hartshorn, MD  glipiZIDE (GLUCOTROL) 5 MG tablet Take 1 tablet (5 mg total) by mouth daily before breakfast. 12/09/21   Tat, Onalee Hua, MD  lisinopril (ZESTRIL) 20 MG tablet Take 1 tablet (20 mg total) by mouth daily. 12/10/21   Catarina Hartshorn, MD  metFORMIN (GLUCOPHAGE) 500 MG tablet Take 1 tablet (500 mg total) by mouth 2 (two) times daily with a meal. 12/09/21   Catarina Hartshorn, MD     Allergies    Patient has no known allergies.   Review of Systems   Review of Systems Please see HPI for pertinent positives and negatives  Physical Exam BP (!) 136/92 (BP Location: Right Arm)   Pulse 64   Temp 97.7 F (36.5 C) (Oral)   Resp 18   Ht 5\' 10"  (1.778 m)    Wt 79.4 kg   SpO2 100%   BMI 25.11 kg/m   Physical Exam Vitals and nursing note reviewed.  Constitutional:      Appearance: Normal appearance.  HENT:     Head: Normocephalic and atraumatic.     Nose: Nose normal.     Mouth/Throat:     Mouth: Mucous membranes are moist.  Eyes:     Extraocular Movements: Extraocular movements intact.     Conjunctiva/sclera: Conjunctivae normal.  Cardiovascular:     Rate and Rhythm: Normal rate.  Pulmonary:     Effort: Pulmonary effort is normal.     Breath sounds: Normal breath sounds.  Abdominal:     General: Abdomen is flat.     Palpations: Abdomen is soft.     Tenderness: There is no abdominal tenderness.  Musculoskeletal:        General: No swelling. Normal range of motion.     Cervical back: Neck supple.  Skin:    General: Skin is warm and dry.  Neurological:     Mental Status: He is alert.     Cranial Nerves: Cranial nerve deficit (flattening for the R nasolabial fold) present.     Sensory: No sensory deficit.     Motor: No weakness.     Comments:  Patient with expressive aphasia  Psychiatric:        Mood and Affect: Mood normal.     ED Results / Procedures / Treatments   EKG EKG Interpretation  Date/Time:  Monday January 04 2023 22:05:48 EDT Ventricular Rate:  94 PR Interval:  196 QRS Duration: 84 QT Interval:  368 QTC Calculation: 460 R Axis:   35 Text Interpretation: Normal sinus rhythm Artifact Minimal voltage criteria for LVH, may be normal variant ( Cornell product ) Nonspecific ST and T wave abnormality Prolonged QT Abnormal ECG When compared with ECG of 26-Nov-2010 14:49, Non-specific ST-t changes Confirmed by Susy Frizzle [phone]) on 01/05/2023 2:17:43 AM  Procedures Procedures  Medications Ordered in the ED Medications   stroke: early stages of recovery book (has no administration in time range)  acetaminophen (TYLENOL) tablet 650 mg (has no administration in time range)    Or  acetaminophen (TYLENOL) 160  MG/5ML solution 650 mg (has no administration in time range)    Or  acetaminophen (TYLENOL) suppository 650 mg (has no administration in time range)  insulin aspart (novoLOG) injection 0-9 Units (has no administration in time range)  insulin aspart (novoLOG) injection 0-5 Units (has no administration in time range)  enoxaparin (LOVENOX) injection 40 mg (has no administration in time range)    Initial Impression and Plan  Patient here with several days of speech disturbance and altered gait. Unknown time of onset but definitely outside the window for thrombolytics or intervention. Labs done in triage show CBC with mild leukocytosis, CMP with borderline hyponatremia and hyperglycemia. UA is clear. Trop is neg. UDS positive for cocaine and THC. EtOH is mildly elevated. I personally viewed the images from radiology studies and agree with radiologist interpretation: CT concerning for multiple areas of subacute stroke. Discussed this with the patient and ex-wife at bedside, recommend admission for further evaluation. Hospitalist paged for admission.     ED Course       MDM Rules/Calculators/A&P Medical Decision Making Problems Addressed: Acute stroke due to ischemia [facility]): acute illness or injury  Amount and/or Complexity of Data Reviewed Labs: ordered. Decision-making details documented in ED Course. Radiology: ordered and independent interpretation performed. Decision-making details documented in ED Course. ECG/medicine tests: ordered and independent interpretation performed. Decision-making details documented in ED Course.  Risk Decision regarding hospitalization.     Final Clinical Impression(s) / ED Diagnoses Final diagnoses:  Acute stroke due to ischemia [facility])    Rx / DC Orders ED Discharge Orders     None        Pollyann Savoy, MD 01/05/23 [id number]

## 2023-01-05 NOTE — ED Notes (Signed)
Provider notified of NIH assessment change

## 2023-01-05 NOTE — H&P (Signed)
History and Physical    Patient: Cameron [name]:[id number] DOB: [birth year] DOA: 01/04/2023 DOS: the patient was seen and examined on 01/05/2023 PCP: Elfredia Nevins, MD  Patient coming from: Home  Chief Complaint:  Chief Complaint  Patient presents with   Altered Mental Status   HPI: Cameron [name] is a 57 y.o. male with medical history significant of prior stroke in 2023, hypertension, diabetes mellitus not on insulin, dyslipidemia, polysubstance abuse that includes alcohol, cocaine and THC.  Patient presented to the ER after 4-5 days of having strokelike symptoms that involve difficulty speaking, difficulty walking and left-sided weakness.  Upon presentation to the triage area he was confused had slurred speech and was having difficulty walking he admitted to medication noncompliance.  Initial vital signs reviewed afebrile state, no tachycardia, normal respiratory effort and BP 151/98 room air sats were 100%.  Noncontrasted CT of the head revealed patchy multifocal areas of decreased attenuation consistent with acute to subacute ischemia.  ER asked hospitalist to evaluate the patient for admission.  Upon my evaluation of the patient his expressive aphasia had improved significantly.  He was no longer confused.  He had left-sided weakness.  Sensation was intact.   Review of Systems: As mentioned in the history of present illness. All other systems reviewed and are negative. Past Medical History:  Diagnosis Date   Chronic shoulder pain    Hypertension    Stroke [facility])    History reviewed. No pertinent surgical history. Social History:  reports that he has been smoking cigarettes. He has been smoking an average of .5 packs per day. He has never used smokeless tobacco. He reports current alcohol use. He reports current drug use. Drugs: Cocaine and Marijuana.  No Known Allergies  History reviewed. No pertinent family history.  Prior to Admission medications   Medication Sig Start  Date End Date Taking? Authorizing Provider  aspirin 81 MG chewable tablet Chew 1 tablet (81 mg total) by mouth daily. Patient not taking: Reported on 01/05/2023 12/10/21   Catarina Hartshorn, MD  atorvastatin (LIPITOR) 20 MG tablet Take 1 tablet (20 mg total) by mouth daily. Patient not taking: Reported on 01/05/2023 12/10/21   Catarina Hartshorn, MD  blood glucose meter kit and supplies KIT Dispense based on patient and insurance preference. Use up to four times daily as directed. 12/09/21   Catarina Hartshorn, MD  clopidogrel (PLAVIX) 75 MG tablet Take 1 tablet (75 mg total) by mouth daily. Patient not taking: Reported on 01/05/2023 12/10/21   Catarina Hartshorn, MD  glipiZIDE (GLUCOTROL) 5 MG tablet Take 1 tablet (5 mg total) by mouth daily before breakfast. Patient not taking: Reported on 01/05/2023 12/09/21   Catarina Hartshorn, MD  lisinopril (ZESTRIL) 20 MG tablet Take 1 tablet (20 mg total) by mouth daily. Patient not taking: Reported on 01/05/2023 12/10/21   Catarina Hartshorn, MD  metFORMIN (GLUCOPHAGE) 500 MG tablet Take 1 tablet (500 mg total) by mouth 2 (two) times daily with a meal. Patient not taking: Reported on 01/05/2023 12/09/21   Catarina Hartshorn, MD    Physical Exam: Vitals:   01/05/23 0445 01/05/23 0446 01/05/23 0515 01/05/23 0911  BP:   (!) 136/92 (!) 140/103  Pulse: 66  64 (!) 59  Resp: 14  18 18   Temp:  97.6 F (36.4 C) 97.7 F (36.5 C) (!) 97.4 F (36.3 C)  TempSrc:  Oral Oral Oral  SpO2: 98%  100% 100%  Weight:      Height:  Constitutional: NAD, calm, comfortable Respiratory: clear to auscultation bilaterally, no wheezing, no crackles. Normal respiratory effort. No accessory muscle use.  Cardiovascular: Regular rate and rhythm, no murmurs / rubs / gallops. No extremity edema. 2+ pedal pulses. No carotid bruits.  Abdomen: no tenderness, no masses palpated. No hepatosplenomegaly. Bowel sounds positive.  Musculoskeletal: no clubbing / cyanosis. No joint deformity upper and lower extremities. Good ROM, no contractures.  Normal muscle tone.  Skin: no rashes, lesions, ulcers. No induration Neurologic: CN 2-12 grossly intact-no facial drooping, tongue midline. Sensation intact, DTR normal. Strength 5/5 x, right and 3+/5 on the left.  Left upper extremity apraxia with finger to 90s.  Gait not assessed but I later observed the patient walking with therapy and he had some minor ataxia and was assisted by 2 providers. Psychiatric: Normal judgment and insight. Alert and oriented x 3. Normal mood.     Data Reviewed:  Sodium 130 in the context of glucose 235, potassium 3.7, BUN 9, creatinine 0.77, LFTs normal, troponin flat at 4, white count 12,500 with a hemoglobin of 15 and platelets 217,000  Urinalysis with glycosuria greater than 500 and urine specific gravity borderline low at 1.005  Ethyl alcohol 49 with urine drug screen positive for cocaine and THC  Noncontrasted CT as above  Since evaluation in the ER patient has undergone a noncontrasted MRI/MRA of the brain: Patchy acute infarction of the posterior division left MCA territory high-grade narrowing of the left M1 segment since May 2023 comparison.  The interface between the normal stenotic vessel is fairly abrupt and this may reflect recent partial thrombosis.  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  Defer to neurology team whether he  needs CTA to evaluate for any large vessel disease History of CVA May 2023  Diabetes mellitus 2 on oral medications Patient admits to nonadherence with all medications Presented with glycosuria greater than 500 at time of admission with serum glucose 235 and pseudohyponatremia Hemoglobin A1c May 2023 was 8.3 Follow CBGs before meals and at bedtime and provide SSI Resume previous Glucotrol and metformin although patient stated he was not taking Patient counseled on necessity of treating underlying diabetes to prevent strokes, heart attacks, and other vascular disease  Hypertension Currently allowing for permissive hypertension in context of acute stroke.  Defer to neurology as to when to resume antihypertensive medications Patient previously prescribed Zestril although was not taking  HLD See above regarding CVA Previously was on Lipitor 20 daily but again was not taking this medication prior to admission  Polysubstance abuse Patient admits to ongoing tobacco use, regular alcohol use and cocaine EtOH level at presentation was 49 Urine drug screen positive for cocaine and THC Patient counseled regarding cessation Will follow CIWA scores and if begin to rise we will institute CIWA protocol.  Would like to avoid Ativan in the setting of acute CVA if at all possible    Advance Care Planning:  Code Status: Full Code   VTE prophylaxis: Lovenox  Consults: Neurology  Family Communication: Patient only  Severity of Illness: The appropriate patient status for this patient is OBSERVATION. Observation status is judged to be reasonable and necessary in order to provide the required intensity of service to ensure the patient's safety. The patient's presenting symptoms, physical exam findings, and initial radiographic and laboratory data in the context of their medical condition is felt to place them at decreased risk for further clinical deterioration. Furthermore, it is anticipated that the  patient will be medically stable for discharge from the hospital within 2 midnights of admission.   Author: Junious Silk, NP 01/05/2023 9:34 AM  For on call review [email or web address].

## 2023-01-05 NOTE — Consult Note (Signed)
I connected with  Cameron [name] on 01/05/23 by a video enabled telemedicine application and verified that I am speaking with the correct person using two identifiers.   I discussed the limitations of evaluation and management by telemedicine. The patient expressed understanding and agreed to proceed.  Location of patient: [facility] Location of physician: [facility]   Neurology Consultation Reason for Consult: stroke Referring Physician: Dr Maurilio Lovely  CC: Altered mental status, slurred speech  History is obtained from: Patient, sister at bedside chart review  HPI: Cameron [name] is a 57 y.o. male with past medical history of stroke with residual left arm weakness, hypertension, cocaine use disorder, alcohol use disorder who presented with altered mental status, slurred speech and ataxia.  Patient has some expressive aphasia therefore is having difficulty providing history but does appropriately say yes or no.  Per sister, patient symptoms started on Friday, 01/01/2023.  Patient agrees that this started at least more than 48 hours ago.  Patient also reports not taking his prescribed aspirin as he did not think he needed it.  Reports nicotine use, alcohol use, cocaine use.  Of note, blood pressure on arrival was 136/92.  Alcohol level on arrival was 49 and UDS was positive for cocaine and THC.  Sodium was 130, blood glucose was 235 with urine glucose more than 500, WBC was 12.5, afebrile.  On chart review, patient has been seen by neurology and May 2023 for left arm weakness.  MRI brain at that time showed right internal capsule and corona radiator infarct.  Patient was discharged on dual antiplatelets for 3 weeks followed by aspirin 81 mg daily.  Last known normal: 6/40/2024 Event happened at home No tPA as outside window No thrombectomy as outside window mRS 0  ROS: All other systems reviewed and negative except as noted in the HPI.   Past Medical History:   Diagnosis Date   Chronic shoulder pain    Hypertension    Stroke [facility])     History reviewed. No pertinent family history.  Social History:  reports that he has been smoking cigarettes. He has been smoking an average of .5 packs per day. He has never used smokeless tobacco. He reports current alcohol use. He reports current drug use. Drugs: Cocaine and Marijuana.   Medications Prior to Admission  Medication Sig Dispense Refill Last Dose   aspirin 81 MG chewable tablet Chew 1 tablet (81 mg total) by mouth daily. (Patient not taking: Reported on 01/05/2023)   Not Taking   atorvastatin (LIPITOR) 20 MG tablet Take 1 tablet (20 mg total) by mouth daily. (Patient not taking: Reported on 01/05/2023) 30 tablet 1 Not Taking   blood glucose meter kit and supplies KIT Dispense based on patient and insurance preference. Use up to four times daily as directed. 1 each 1    clopidogrel (PLAVIX) 75 MG tablet Take 1 tablet (75 mg total) by mouth daily. (Patient not taking: Reported on 01/05/2023) 20 tablet 0 Not Taking   glipiZIDE (GLUCOTROL) 5 MG tablet Take 1 tablet (5 mg total) by mouth daily before breakfast. (Patient not taking: Reported on 01/05/2023) 30 tablet 1 Not Taking   lisinopril (ZESTRIL) 20 MG tablet Take 1 tablet (20 mg total) by mouth daily. (Patient not taking: Reported on 01/05/2023) 30 tablet 1 Not Taking   metFORMIN (GLUCOPHAGE) 500 MG tablet Take 1 tablet (500 mg total) by mouth 2 (two) times daily with a meal. (Patient not taking: Reported on 01/05/2023)  60 tablet 1 Not Taking      Exam: Current vital signs: BP 130/84 (BP Location: Right Arm)   Pulse (!) 56   Temp 98.4 F (36.9 C) (Oral)   Resp 18   Ht 5\' 10"  (1.778 m)   Wt 79.4 kg   SpO2 100%   BMI 25.11 kg/m  Vital signs in last 24 hours: Temp:  [97.4 F (36.3 C)-98.4 F (36.9 C)] 98.4 F (36.9 C) (06/18 1138) Pulse Rate:  [56-92] 56 (06/18 1138) Resp:  [14-18] 18 (06/18 1138) BP: (107-151)/(73-103) 130/84 (06/18  1138) SpO2:  [95 %-100 %] 100 % (06/18 1138) Weight:  [79.4 kg] 79.4 kg (06/17 2202)   Physical Exam  Constitutional: Appears well-developed and well-nourished.  Psych: Affect appropriate to situation Neuro: Awake, alert, oriented to self, follows commands but unable to name objects correctly, cranial nerves II to XII appear grossly intact, antigravity send without drift in upper extremities, FTN intact bilaterally, sensation intact to light touch  NIHSS 4  INPUTS: 1A: Level of consciousness --> 0 = Alert; keenly responsive 1B: Ask month and age --> 2 = 0 questions right  1C: 'Blink eyes' & 'squeeze hands' --> 0 = Performs both tasks 2: Horizontal extraocular movements --> 0 = Normal 3: Visual fields --> 0 = No visual loss 4: Facial palsy --> 0 = Normal symmetry 5A: Left arm motor drift --> 0 = No drift for 10 seconds 5B: Right arm motor drift --> 0 = No drift for 10 seconds 6A: Left leg motor drift --> 0 = No drift for 5 seconds 6B: Right leg motor drift --> 0 = No drift for 5 seconds 7: Limb Ataxia --> 0 = No ataxia 8: Sensation --> 0 = Normal; no sensory loss 9: Language/aphasia --> 2 = Severe aphasia: fragmentary expression, inference needed, cannot identify materials 10: Dysarthria --> 0 = Normal 11: Extinction/inattention --> 0 = No abnormality   I have reviewed labs in epic and the results pertinent to this consultation are: CBC:  Recent Labs  Lab 01/04/23 2340  WBC 12.5*  NEUTROABS 10.3*  HGB 15.1  HCT 46.5  MCV 84.7  PLT 217    Basic Metabolic Panel:  Lab Results  Component Value Date   NA 130 (L) 01/04/2023   K 3.7 01/04/2023   CO2 22 01/04/2023   GLUCOSE 235 (H) 01/04/2023   BUN 9 01/04/2023   CREATININE 0.77 01/04/2023   CALCIUM 9.2 01/04/2023   GFRNONAA >60 01/04/2023   GFRAA  11/26/2010    >60        The eGFR has been calculated using the MDRD equation. This calculation has not been validated in all clinical situations. eGFR's  persistently <60 mL/min signify possible Chronic Kidney Disease.   Lipid Panel:  Lab Results  Component Value Date   LDLCALC 79 12/09/2021   HgbA1c:  Lab Results  Component Value Date   HGBA1C 5.9 (H) 01/04/2023   Urine Drug Screen:     Component Value Date/Time   LABOPIA NONE DETECTED 01/04/2023 2350   COCAINSCRNUR POSITIVE (A) 01/04/2023 2350   LABBENZ NONE DETECTED 01/04/2023 2350   AMPHETMU NONE DETECTED 01/04/2023 2350   THCU POSITIVE (A) 01/04/2023 2350   LABBARB NONE DETECTED 01/04/2023 2350    Alcohol Level     Component Value Date/Time   ETH 49 (H) 01/04/2023 2340     I have reviewed the images obtained:  CT Head without contrast 01/04/2023: Patchy multifocal areas of decreased attenuation are noted  consistent with acute to subacute ischemia. MRI would be helpful for further evaluation.  MRI brain and MRA head without contrast 01/05/2023:  Patchy acute infarction in the posterior division left MCA territory. High-grade narrowing at the left M1 segment since May 2023 comparison. The interface between normal and stenotic vessel is fairly abrupt and this may reflect recent, partial thrombosis. Premature chronic small vessel ischemia and intracranial atherosclerosis.    ASSESSMENT/PLAN: 56 year old male with multiple medical comorbidities as noted above presented with aphasia.  Acute ischemic stroke Hypertension Hyperlipidemia Cocaine use disorder Alcohol use disorder Nicotine use disorder Cannabis use disorder -Etiology: Likely embolic  Recommendations: -Recommend aspirin 81 mg daily and Plavix and 5 mg daily for 3 months followed by aspirin 81 mg daily.  Counseled patient about the importance of taking this medication. -Recommend atorvastatin 80 mg daily -Will discuss with Dr. Pearlean Brownie if we need to obtain a CTA head and neck to better evaluate the left M1 stenosis/partial occlusion.  If not, will order carotid ultrasound -TTE ordered and pending.  If  negative, would recommend 30 day cardiac monitor to look for paroxysmal A-fib -Counseled patient against alcohol use, cocaine use, THC use as well as smoking cessation -PT/OT/speech therapy/swallow eval -Goal blood pressure: Normotension.  Please avoid hypotension as patient has significant narrowing of left M1.  If there is recurrence of symptoms, please make sure patient's head is flat, blood pressure is normotensive.  If symptoms do not improve, would need stat CT head and as well as CTA head and neck due to concern for occlusion of left M1 -Stroke education including BEFAST -Modification stroke risk factors -Follow-up with Dr. Pearlean Brownie in 3 to 4 weeks (order placed) -Discussed labs in detail with patient, sister at bedside as well as Dr. Sherryll Burger via secure chat  Thank you for allowing [place] to participate in the care of this patient. If you have any further questions, please contact  me or neurohospitalist.   Lindie Spruce Epilepsy [facility] neurohospitalist

## 2023-01-05 NOTE — ED Notes (Signed)
Upon entering room, there was an unknown source of fluid in the floor. It was noticed that the trashcan was wet as well. When asked pt what happened, pt stated they urinated in the trashcan/floor

## 2023-01-05 NOTE — Telephone Encounter (Signed)
Received request for a 30 day monitor for stroke. Patient enrolled in [employer]. Order placed in epic.

## 2023-01-05 NOTE — Progress Notes (Signed)
Awaiting admit orders for MD on call.

## 2023-01-05 NOTE — Evaluation (Addendum)
Physical Therapy Evaluation Patient Details Name: Cameron [name] MRN: [id number] DOB: August 11, 1965 Today's Date: 01/05/2023  History of Present Illness  Cameron [name] is a 57 y.o. male with prior history of stroke with some residual L arm weakness brought to the ED by ex-wife for evaluation of new confusion, speech difficulty and ataxia. She does not live with him but reports she suspects symptoms began about 4-5 days ago because he got sent home from work early due to some conflict there and she spoke with his sister who tried to call him the next day and noted his speech to be abnormal. He is unable to give much additional history.   Clinical Impression  Patient functioning near baseline for functional mobility and gait other mildly decreased fine motor control of fingers and slight expressive speech difficulties, otherwise demonstrates good return for ambulating in room/hallways without loss of balance.  Plan:  Patient discharged from physical therapy to care of nursing for ambulation daily as tolerated for length of stay.         Recommendations for follow up therapy are one component of a multi-disciplinary discharge planning process, led by the attending physician.  Recommendations may be updated based on patient status, additional functional criteria and insurance authorization.  Follow Up Recommendations       Assistance Recommended at Discharge    Patient can return home with the following  Help with stairs or ramp for entrance    Equipment Recommendations None recommended by PT  Recommendations for Other Services       Functional Status Assessment Patient has not had a recent decline in their functional status     Precautions / Restrictions Precautions Precautions: Fall Restrictions Weight Bearing Restrictions: No      Mobility  Bed Mobility Overal bed mobility: Independent                  Transfers Overall transfer level: Independent                       Ambulation/Gait Ambulation/Gait assistance: Modified independent (Device/Increase time) Gait Distance (Feet): 200 Feet Assistive device: None Gait Pattern/deviations: Drifts right/left, WFL(Within Functional Limits) Gait velocity: near normal     General Gait Details: grossly WFL other than occasional drifting right/left without loss of balance, good return for completing 180 degree turns with no loss of balance, no need for an AD  Stairs            Wheelchair Mobility    Modified Rankin (Stroke Patients Only)       Balance Overall balance assessment: No apparent balance deficits (not formally assessed)                                           Pertinent Vitals/Pain Pain Assessment Pain Assessment: No/denies pain    Home Living Family/patient expects to be discharged to:: Private residence Living Arrangements: Non-relatives/Friends Available Help at Discharge: Friend(s);Family;Available 24 hours/day Type of Home: House Home Access: Ramped entrance       Home Layout: One level Home Equipment: Shower seat;BSC/3in1      Prior Function Prior Level of Function : Independent/Modified Independent             Mobility Comments: Tourist information centre manager without AD ADLs Comments: independent     Hand Dominance   Dominant Hand: Right    Extremity/Trunk  Assessment   Upper Extremity Assessment Upper Extremity Assessment: Defer to OT evaluation RUE Deficits / Details: Good strength but noted difficulty with fine motor tasks. Decreased fine motor coordination. RUE Sensation: WNL RUE Coordination: decreased fine motor;decreased gross motor LUE Deficits / Details: Good strength. Mild deficits in gross motor coordination/praxis for finger to nose test. LUE Sensation: WNL LUE Coordination: decreased gross motor    Lower Extremity Assessment Lower Extremity Assessment: Overall WFL for tasks assessed    Cervical / Trunk  Assessment Cervical / Trunk Assessment: Normal  Communication   Communication: Expressive difficulties  Cognition Arousal/Alertness: Awake/alert Behavior During Therapy: WFL for tasks assessed/performed Overall Cognitive Status: Within Functional Limits for tasks assessed                                          General Comments      Exercises     Assessment/Plan    PT Assessment Patient does not need any further PT services  PT Problem List         PT Treatment Interventions      PT Goals (Current goals can be found in the Care Plan section)  Acute Rehab PT Goals Patient Stated Goal: return home PT Goal Formulation: With patient Time For Goal Achievement: 01/05/23 Potential to Achieve Goals: Good    Frequency       Co-evaluation PT/OT/SLP Co-Evaluation/Treatment: Yes Reason for Co-Treatment: To address functional/ADL transfers PT goals addressed during session: Mobility/safety with mobility;Balance OT goals addressed during session: ADL's and self-care       AM-PAC PT "6 Clicks" Mobility  Outcome Measure Help needed turning from your back to your side while in a flat bed without using bedrails?: None Help needed moving from lying on your back to sitting on the side of a flat bed without using bedrails?: None Help needed moving to and from a bed to a chair (including a wheelchair)?: None Help needed standing up from a chair using your arms (e.g., wheelchair or bedside chair)?: None Help needed to walk in hospital room?: None Help needed climbing 3-5 steps with a railing? : None 6 Click Score: 24    End of Session   Activity Tolerance: Patient tolerated treatment well Patient left: in chair;with call bell/phone within reach Nurse Communication: Mobility status PT Visit Diagnosis: Unsteadiness on feet (R26.81);Other abnormalities of gait and mobility (R26.89);Muscle weakness (generalized) (M62.81)    Time: 1610-9604 PT Time Calculation  (min) (ACUTE ONLY): 20 min   Charges:   PT Evaluation $PT Eval Moderate Complexity: 1 Mod PT Treatments $Therapeutic Activity: 8-22 mins        12:05 PM, 01/05/23 Ocie Bob, MPT Physical Therapist with [facility] [phone] office [phone] mobile phone

## 2023-01-05 NOTE — Progress Notes (Signed)
Patient bought to the floor. Patient has expressive aphasia. Alert and answering yes and no questions.

## 2023-01-05 NOTE — TOC Initial Note (Signed)
Transition of Care [facility]) - Initial/Assessment Note    Patient Details  Name: Cameron [name] MRN: [id number] Date of Birth: [birth year]  Transition of Care [facility]) CM/SW Contact:    Cameron Needy, LCSW Phone Number: 01/05/2023, 11:27 AM  Clinical Narrative:                 Patient uninsured. Has not been taking prescribed medications. Has not been seeing PCP. Agreeable to OPPT referral and [facility] referral. Referred to [facility] and [facility]. Cameron [name] with [facility] indicates that it is showing that patient has [employer] CVA effective 09/18/22. Discussed that it was showing in TOC as uninsured. Cameron [name] with [facility] to follow up with patient to clarify if he is insured upon his d/c.  On 12/10/22, patient was provided PCP listing, SA resources, and was referred [facility] OPP[facility].  Expected Discharge Plan: OP Rehab Barriers to Discharge: No Barriers Identified   Patient Goals and CMS Choice Patient states their goals for this hospitalization and ongoing recovery are:: return home          Expected Discharge Plan and Services       Living arrangements for the past 2 months: Single Family Home                                      Prior Living Arrangements/Services Living arrangements for the past 2 months: Single Family Home Lives with:: Friends Patient language and need for interpreter reviewed:: Yes        Need for Family Participation in Patient Care: Yes (Comment) Care giver support system in place?: Yes (comment)   Criminal Activity/Legal Involvement Pertinent to Current Situation/Hospitalization: No - Comment as needed  Activities of Daily Living Home Assistive Devices/Equipment: None ADL Screening (condition at time of admission) Patient's cognitive ability adequate to safely complete daily activities?: Yes Is the patient deaf or have difficulty hearing?: No Does the patient have difficulty seeing, even when wearing glasses/contacts?: No Does  the patient have difficulty concentrating, remembering, or making decisions?: Yes Patient able to express need for assistance with ADLs?: No Does the patient have difficulty dressing or bathing?: No Independently performs ADLs?: Yes (appropriate for developmental age) Does the patient have difficulty walking or climbing stairs?: No Weakness of Legs: None Weakness of Arms/Hands: None  Permission Sought/Granted                  Emotional Assessment     Affect (typically observed): Appropriate Orientation: : Oriented to Self, Oriented to Place, Oriented to  Time, Oriented to Situation Alcohol / Substance Use: Not Applicable, Alcohol Use, Illicit Drugs Psych Involvement: No (comment)  Admission diagnosis:  CVA (cerebral vascular accident) (HCC) [I63.9] Acute stroke due to ischemia [facility]) [I63.9] Patient Active Problem List   Diagnosis Date Noted   CVA (cerebral vascular accident) (HCC) 01/05/2023   HTN (hypertension) 12/08/2021   Acute ischemic stroke (HCC) 12/08/2021   Uncontrolled type 2 diabetes mellitus with hyperglycemia, without long-term current use of insulin (HCC) 12/08/2021   Hyperlipidemia 12/08/2021   Tobacco abuse 12/08/2021   Alcohol use 12/08/2021   Cervical spine degeneration 12/08/2021   Hyponatremia 12/08/2021   PCP:  Cameron Nevins, MD Pharmacy:   [facility] - [place], [place] - [address] [place] [address] Phone: [phone] Fax: [phone]     Social Determinants of Health (SDOH) Social History:  SDOH Screenings   Food Insecurity: No Food Insecurity (01/05/2023)  Housing: Patient Unable To Answer (01/05/2023)  Transportation Needs: No Transportation Needs (01/05/2023)  Utilities: Not At Risk (01/05/2023)  Tobacco Use: High Risk (01/05/2023)   SDOH Interventions:     Readmission Risk Interventions    12/09/2021   12:18 PM  Readmission Risk Prevention Plan  Medication Screening Complete  Transportation  Screening Complete

## 2023-01-05 NOTE — Evaluation (Signed)
Occupational Therapy Evaluation Patient Details Name: Cameron [name] MRN: [id number] DOB: [birth year] Today's Date: 01/05/2023   History of Present Illness Cameron [name] is a 57 y.o. male with prior history of stroke with some residual L arm weakness brought to the ED by ex-wife for evaluation of new confusion, speech difficulty and ataxia. She does not live with him but reports she suspects symptoms began about 4-5 days ago because he got sent home from work early due to some conflict there and she spoke with his sister who tried to call him the next day and noted his speech to be abnormal. He is unable to give much additional history. (Per MD)   Clinical Impression   Pt agreeable to OT and PT co-evaluation. Pt is independent at baseline and was largely independent today. Mild assist given to pt when donning his belt, but he could have likely done this on his own if his gown was not in the way. Noted fine motor deficits in R hand which may have also contributed to difficulty managing the belt. Independent ambulation during session. Pt is not recommended for further acute OT services and will be discharged to care of nursing staff. Further treatment can be done at the next venue of care.      Recommendations for follow up therapy are one component of a multi-disciplinary discharge planning process, led by the attending physician.  Recommendations may be updated based on patient status, additional functional criteria and insurance authorization.   Assistance Recommended at Discharge PRN        Functional Status Assessment  Patient has had a recent decline in their functional status and demonstrates the ability to make significant improvements in function in a reasonable and predictable amount of time.  Equipment Recommendations  None recommended by OT           Precautions / Restrictions Precautions Precautions: Fall Restrictions Weight Bearing Restrictions: No      Mobility Bed  Mobility Overal bed mobility: Independent                  Transfers Overall transfer level: Independent Equipment used: None                      Balance Overall balance assessment: Mild deficits observed, not formally tested                                         ADL either performed or assessed with clinical judgement   ADL Overall ADL's : Modified independent;Independent                         Toilet Transfer: Independent;Ambulation Toilet Transfer Details (indicate cue type and reason): Simulated via ambulation in the hall.         Functional mobility during ADLs: Independent General ADL Comments: Pt able to walk over 100 feet in the hall.     Vision Baseline Vision/History: 0 No visual deficits Ability to See in Adequate Light: 0 Adequate Patient Visual Report: No change from baseline Additional Comments: Mild R side innatention noted when ambulating, but nothing significant.                Pertinent Vitals/Pain Pain Assessment Pain Assessment: No/denies pain     Hand Dominance Right   Extremity/Trunk Assessment Upper Extremity Assessment Upper Extremity Assessment:  RUE deficits/detail;LUE deficits/detail RUE Deficits / Details: Good strength but noted difficulty with fine motor tasks. Decreased fine motor coordination. RUE Sensation: WNL RUE Coordination: decreased fine motor;decreased gross motor LUE Deficits / Details: Good strength. Mild deficits in gross motor coordination/praxis for finger to nose test. LUE Sensation: WNL LUE Coordination: decreased gross motor   Lower Extremity Assessment Lower Extremity Assessment: Defer to PT evaluation   Cervical / Trunk Assessment Cervical / Trunk Assessment: Normal   Communication Communication Communication: Expressive difficulties   Cognition Arousal/Alertness: Awake/alert Behavior During Therapy: WFL for tasks assessed/performed Overall Cognitive  Status: Within Functional Limits for tasks assessed                                                        Home Living Family/patient expects to be discharged to:: Private residence Living Arrangements: Non-relatives/Friends Available Help at Discharge: Friend(s);Family;Available 24 hours/day Type of Home: House Home Access: Ramped entrance     Home Layout: One level     Bathroom Shower/Tub: Chief Strategy Officer: Handicapped height     Home Equipment: Shower seat;BSC/3in1          Prior Functioning/Environment Prior Level of Function : Independent/Modified Independent             Mobility Comments: Tourist information centre manager without AD ADLs Comments: independent                                Co-evaluation PT/OT/SLP Co-Evaluation/Treatment: Yes Reason for Co-Treatment: To address functional/ADL transfers   OT goals addressed during session: ADL's and self-care      AM-PAC OT "6 Clicks" Daily Activity     Outcome Measure Help from another person eating meals?: None Help from another person taking care of personal grooming?: None Help from another person toileting, which includes using toliet, bedpan, or urinal?: None Help from another person bathing (including washing, rinsing, drying)?: None Help from another person to put on and taking off regular upper body clothing?: None Help from another person to put on and taking off regular lower body clothing?: None 6 Click Score: 24   End of Session    Activity Tolerance: Patient tolerated treatment well Patient left: in chair;with call bell/phone within reach;with family/visitor present  OT Visit Diagnosis: Muscle weakness (generalized) (M62.81);Other symptoms and signs involving the nervous system (Z61.096)                Time: 0454-0981 OT Time Calculation (min): 15 min Charges:  OT General Charges $OT Visit: 1 Visit OT Evaluation $OT Eval Low Complexity: 1  Low  Zyair Russi OT, MOT  Danie Chandler 01/05/2023, 9:20 AM

## 2023-01-06 ENCOUNTER — Emergency Department (HOSPITAL_COMMUNITY)
Admission: EM | Admit: 2023-01-06 | Discharge: 2023-01-06 | Disposition: A | Discharge status: Home / Self Care | Payer: 59 | Attending: Emergency Medicine | Admitting: Emergency Medicine

## 2023-01-06 ENCOUNTER — Other Ambulatory Visit: Payer: Self-pay

## 2023-01-06 ENCOUNTER — Other Ambulatory Visit (HOSPITAL_COMMUNITY): Payer: Self-pay | Admitting: *Deleted

## 2023-01-06 ENCOUNTER — Observation Stay (HOSPITAL_COMMUNITY): Payer: 59

## 2023-01-06 ENCOUNTER — Encounter (HOSPITAL_COMMUNITY): Payer: Self-pay | Admitting: Emergency Medicine

## 2023-01-06 ENCOUNTER — Telehealth: Payer: Self-pay

## 2023-01-06 DIAGNOSIS — T383X6A Underdosing of insulin and oral hypoglycemic [antidiabetic] drugs, initial encounter: Secondary | ICD-10-CM | POA: Diagnosis not present

## 2023-01-06 DIAGNOSIS — I6389 Other cerebral infarction: Secondary | ICD-10-CM | POA: Diagnosis not present

## 2023-01-06 DIAGNOSIS — F1410 Cocaine abuse, uncomplicated: Secondary | ICD-10-CM | POA: Diagnosis not present

## 2023-01-06 DIAGNOSIS — E785 Hyperlipidemia, unspecified: Secondary | ICD-10-CM | POA: Diagnosis not present

## 2023-01-06 DIAGNOSIS — Z7982 Long term (current) use of aspirin: Secondary | ICD-10-CM | POA: Insufficient documentation

## 2023-01-06 DIAGNOSIS — I639 Cerebral infarction, unspecified: Secondary | ICD-10-CM | POA: Diagnosis not present

## 2023-01-06 DIAGNOSIS — Y902 Blood alcohol level of 40-59 mg/100 ml: Secondary | ICD-10-CM | POA: Diagnosis not present

## 2023-01-06 DIAGNOSIS — E119 Type 2 diabetes mellitus without complications: Secondary | ICD-10-CM | POA: Diagnosis not present

## 2023-01-06 DIAGNOSIS — I63412 Cerebral infarction due to embolism of left middle cerebral artery: Secondary | ICD-10-CM | POA: Diagnosis not present

## 2023-01-06 DIAGNOSIS — T464X6A Underdosing of angiotensin-converting-enzyme inhibitors, initial encounter: Secondary | ICD-10-CM | POA: Diagnosis not present

## 2023-01-06 DIAGNOSIS — Z79899 Other long term (current) drug therapy: Secondary | ICD-10-CM | POA: Diagnosis not present

## 2023-01-06 DIAGNOSIS — I69334 Monoplegia of upper limb following cerebral infarction affecting left non-dominant side: Secondary | ICD-10-CM | POA: Diagnosis not present

## 2023-01-06 DIAGNOSIS — R81 Glycosuria: Secondary | ICD-10-CM | POA: Diagnosis not present

## 2023-01-06 DIAGNOSIS — Z8673 Personal history of transient ischemic attack (TIA), and cerebral infarction without residual deficits: Secondary | ICD-10-CM | POA: Insufficient documentation

## 2023-01-06 DIAGNOSIS — R29704 NIHSS score 4: Secondary | ICD-10-CM | POA: Diagnosis not present

## 2023-01-06 DIAGNOSIS — G8194 Hemiplegia, unspecified affecting left nondominant side: Secondary | ICD-10-CM | POA: Diagnosis not present

## 2023-01-06 DIAGNOSIS — Z7902 Long term (current) use of antithrombotics/antiplatelets: Secondary | ICD-10-CM | POA: Diagnosis not present

## 2023-01-06 DIAGNOSIS — Z5329 Procedure and treatment not carried out because of patient's decision for other reasons: Secondary | ICD-10-CM | POA: Diagnosis not present

## 2023-01-06 DIAGNOSIS — R4701 Aphasia: Secondary | ICD-10-CM | POA: Diagnosis not present

## 2023-01-06 DIAGNOSIS — R4182 Altered mental status, unspecified: Secondary | ICD-10-CM | POA: Diagnosis not present

## 2023-01-06 DIAGNOSIS — F1210 Cannabis abuse, uncomplicated: Secondary | ICD-10-CM | POA: Diagnosis not present

## 2023-01-06 DIAGNOSIS — Z91128 Patient's intentional underdosing of medication regimen for other reason: Secondary | ICD-10-CM | POA: Diagnosis not present

## 2023-01-06 DIAGNOSIS — Z7984 Long term (current) use of oral hypoglycemic drugs: Secondary | ICD-10-CM | POA: Diagnosis not present

## 2023-01-06 DIAGNOSIS — F17210 Nicotine dependence, cigarettes, uncomplicated: Secondary | ICD-10-CM | POA: Diagnosis not present

## 2023-01-06 DIAGNOSIS — I10 Essential (primary) hypertension: Secondary | ICD-10-CM | POA: Diagnosis not present

## 2023-01-06 DIAGNOSIS — F1010 Alcohol abuse, uncomplicated: Secondary | ICD-10-CM | POA: Diagnosis not present

## 2023-01-06 DIAGNOSIS — T466X6A Underdosing of antihyperlipidemic and antiarteriosclerotic drugs, initial encounter: Secondary | ICD-10-CM | POA: Diagnosis not present

## 2023-01-06 DIAGNOSIS — T45526A Underdosing of antithrombotic drugs, initial encounter: Secondary | ICD-10-CM | POA: Diagnosis not present

## 2023-01-06 LAB — LIPID PANEL
Cholesterol: 137 mg/dL (ref 0–200)
HDL: 36 mg/dL — ABNORMAL LOW (ref >40)
LDL Cholesterol: 74 mg/dL (ref 0–99)
Total CHOL/HDL Ratio: 3.8 RATIO
Triglycerides: 137 mg/dL (ref <150)
VLDL: 27 mg/dL (ref 0–40)

## 2023-01-06 LAB — HIV ANTIBODY (ROUTINE TESTING W REFLEX): HIV Screen 4th Generation wRfx: NONREACTIVE

## 2023-01-06 LAB — ECHOCARDIOGRAM COMPLETE
Area-P 1/2: 2.99 cm2
Height: 70 in
S' Lateral: 2.8 cm
Weight: 2800 oz

## 2023-01-06 LAB — GLUCOSE, CAPILLARY
Glucose-Capillary: 163 mg/dL — ABNORMAL HIGH (ref 70–99)
Glucose-Capillary: 81 mg/dL (ref 70–99)

## 2023-01-06 MED ORDER — CLOPIDOGREL BISULFATE 75 MG PO TABS: 75.0000 mg | ORAL_TABLET | Freq: Every day | ORAL | 0 refills | Status: AC

## 2023-01-06 MED ORDER — THIAMINE MONONITRATE 100 MG PO TABS: 100.0000 mg | ORAL_TABLET | Freq: Every day | ORAL | Status: DC

## 2023-01-06 MED ORDER — ADULT MULTIVITAMIN W/MINERALS CH: 1.0000 | ORAL_TABLET | Freq: Every day | ORAL | Status: DC

## 2023-01-06 MED ORDER — ATORVASTATIN CALCIUM 80 MG PO TABS: 80.0000 mg | ORAL_TABLET | Freq: Every day | ORAL | 11 refills | Status: AC

## 2023-01-06 MED ORDER — FOLIC ACID 1 MG PO TABS: 1.0000 mg | ORAL_TABLET | Freq: Every day | ORAL | Status: DC

## 2023-01-06 NOTE — Progress Notes (Signed)
Patient slept most of the night. No complaints of pain or discomfort. NIH score remains 3-4 with no changes. Plan of care ongoing.

## 2023-01-06 NOTE — Discharge Summary (Signed)
Physician Discharge Summary   Patient: Cameron [name] MRN: [id number] DOB: 1966/03/26  Admit date:     01/06/2023  Discharge date: 01/06/23  Discharge Physician: Kendell Bane   PCP: Elfredia Nevins, MD   Recommendations at discharge: Patient left AGAINST MEDICAL ADVICE Patient eloped  Cameron [name] is a 57 y.o. male with medical history significant of prior stroke in 2023, hypertension, diabetes mellitus not on insulin, dyslipidemia, polysubstance abuse that includes alcohol, cocaine and THC.  Patient presented to the ER after 4-5 days of having strokelike symptoms that involve difficulty speaking, difficulty walking and left-sided weakness.  Upon presentation to the triage area he was confused had slurred speech and was having difficulty walking he admitted to medication noncompliance.   Initial vital signs reviewed afebrile state, no tachycardia, normal respiratory effort and BP 151/98 room air sats were 100%.  Noncontrasted CT of the head revealed patchy multifocal areas of decreased attenuation consistent with acute to subacute ischemia.  ER asked hospitalist to evaluate the patient for admission.   Upon my evaluation of the patient his expressive aphasia had improved significantly.  He was no longer confused.  He had left-sided weakness.  Sensation was intact.   Acute CVA left MCA territory/associated high-grade narrowing left M1 segment Patient could not recall if he was on aspirin and Plavix-will resume; I doubt he was taking regularly based on his self-report Echo pending Await neurology input especially regarding the possible thrombosis in the left M1 segment Patient was not taking his Lipitor and given repeat CVA will increase his prior dosage from 20 mg to 80 mg Hemoglobin A1c and lipid panel also ordered  Patient had carotid duplex 1 year ago-did not reorder.   Defer to neurology team whether he needs CTA to evaluate for any large vessel disease History of CVA May 2023    Neurology recommendations: 81 mg daily and Plavix and 5 mg daily for 3 months followed by aspirin 81 mg daily. atorvastatin 80 mg daily     Diabetes mellitus 2 on oral medications Patient admits to nonadherence with all medications Presented with glycosuria greater than 500 at time of admission with serum glucose 235 and pseudohyponatremia Hemoglobin A1c May 2023 was 8.3 Follow CBGs before meals and at bedtime and provide SSI Resume previous Glucotrol and metformin although patient stated he was not taking Patient counseled on necessity of treating underlying diabetes to prevent strokes, heart attacks, and other vascular disease   Hypertension Currently allowing for permissive hypertension in context of acute stroke.  Defer to neurology as to when to resume antihypertensive medications Patient previously prescribed Zestril although was not taking   HLD See above regarding CVA Previously was on Lipitor 20 daily but again was not taking this medication prior to admission   Polysubstance abuse Patient admits to ongoing tobacco use, regular alcohol use and cocaine EtOH level at presentation was 49 Urine drug screen positive for cocaine and THC Patient counseled regarding cessation Will follow CIWA scores   Consult: neurologist Disposition: Left AMA Diet recommendation:  Cardiac and Carb modified diet DISCHARGE MEDICATION:   Discharge Exam: Filed Weights   01/06/23 1247  Weight: 79.4 kg       The results of significant diagnostics from this hospitalization (including imaging, microbiology, ancillary and laboratory) are listed below for reference.   Imaging Studies: US Carotid Bilateral  Result Date: 01/05/2023 CLINICAL DATA:  [id number] Stroke (cerebrum) (HCC) [id number] EXAM: BILATERAL CAROTID DUPLEX ULTRASOUND TECHNIQUE: Wallace Cullens scale imaging, color Doppler and duplex  ultrasound were performed of bilateral carotid and vertebral arteries in the neck. COMPARISON:  12/08/2021  FINDINGS: Criteria: Quantification of carotid stenosis is based on velocity parameters that correlate the residual internal carotid diameter with NASCET-based stenosis levels, using the diameter of the distal internal carotid lumen as the denominator for stenosis measurement. The following velocity measurements were obtained: RIGHT ICA: 74/25 cm/sec CCA: 69/16 cm/sec SYSTOLIC ICA/CCA RATIO:  1.6 ECA: 73 cm/sec LEFT ICA: 69/21 cm/sec CCA: 88/15 cm/sec SYSTOLIC ICA/CCA RATIO:  0.8 ECA: 65 cm/sec RIGHT CAROTID ARTERY: Intimal thickening in the common carotid. No focal plaque accumulation or stenosis. Normal waveforms and color Doppler signal. RIGHT VERTEBRAL ARTERY:  Normal flow direction and waveform. LEFT CAROTID ARTERY: Mild intimal thickening in common carotid. Smooth nonocclusive plaque in the bulb. Normal waveforms and color Doppler signal throughout. LEFT VERTEBRAL ARTERY:  Normal flow direction and waveform. IMPRESSION: 1. Right carotid intimal thickening without stenosis. 2. Left carotid bulb plaque resulting in less than 50% diameter ICA stenosis. 3. Antegrade bilateral vertebral arterial flow. Electronically Signed   By: Corlis Leak M.D.   On: 01/05/2023 20:08   MR BRAIN WO CONTRAST  Result Date: 01/05/2023 CLINICAL DATA:  Neuro deficit with acute stroke suspected EXAM: MRI HEAD WITHOUT CONTRAST MRA HEAD WITHOUT CONTRAST TECHNIQUE: Multiplanar, multi-echo pulse sequences of the brain and surrounding structures were acquired without intravenous contrast. Angiographic images of the Circle of Willis were acquired using MRA technique without intravenous contrast. COMPARISON:  Head CT from yesterday FINDINGS: MRI HEAD FINDINGS Brain: Patchy acute white matter and cortically based infarcts in the left MCA territory affecting the inferior division. There is chronic small vessel ischemia to an extensive degree in the pons and also seen in the cerebral white matter. Remote basal ganglia infarction on the right.  Wallerian degeneration is seen descending the right cerebral peduncle. No acute hemorrhage, hydrocephalus, or collection. Vascular: Major flow voids are preserved Skull and upper cervical spine: Diffusely hypointense marrow signal without focal lesion. Chronic innumerable scalp nodules and scalp thickening favoring scarring. Sinuses/Orbits: No acute finding MRA HEAD FINDINGS Anterior circulation: Intracranial vessel irregularity attributed to atherosclerosis. New or progressive high-grade left M1 segment narrowing with downstream underfilling. The interface between normal and stenotic vessel is fairly abrupt in this may reflect recent, partial thrombosis. Negative for aneurysm. Posterior circulation: The vertebral and basilar arteries are widely patent. Atheromatous irregularity of posterior cerebral branches. IMPRESSION: 1. Patchy acute infarction in the posterior division left MCA territory. High-grade narrowing at the left M1 segment since May 2023 comparison. The interface between normal and stenotic vessel is fairly abrupt and this may reflect recent, partial thrombosis. 2. Premature chronic small vessel ischemia and intracranial atherosclerosis. Electronically Signed   By: Tiburcio Pea M.D.   On: 01/05/2023 08:34   MR ANGIO HEAD WO CONTRAST  Result Date: 01/05/2023 CLINICAL DATA:  Neuro deficit with acute stroke suspected EXAM: MRI HEAD WITHOUT CONTRAST MRA HEAD WITHOUT CONTRAST TECHNIQUE: Multiplanar, multi-echo pulse sequences of the brain and surrounding structures were acquired without intravenous contrast. Angiographic images of the Circle of Willis were acquired using MRA technique without intravenous contrast. COMPARISON:  Head CT from yesterday FINDINGS: MRI HEAD FINDINGS Brain: Patchy acute white matter and cortically based infarcts in the left MCA territory affecting the inferior division. There is chronic small vessel ischemia to an extensive degree in the pons and also seen in the cerebral  white matter. Remote basal ganglia infarction on the right. Wallerian degeneration is seen descending the right cerebral peduncle.  No acute hemorrhage, hydrocephalus, or collection. Vascular: Major flow voids are preserved Skull and upper cervical spine: Diffusely hypointense marrow signal without focal lesion. Chronic innumerable scalp nodules and scalp thickening favoring scarring. Sinuses/Orbits: No acute finding MRA HEAD FINDINGS Anterior circulation: Intracranial vessel irregularity attributed to atherosclerosis. New or progressive high-grade left M1 segment narrowing with downstream underfilling. The interface between normal and stenotic vessel is fairly abrupt in this may reflect recent, partial thrombosis. Negative for aneurysm. Posterior circulation: The vertebral and basilar arteries are widely patent. Atheromatous irregularity of posterior cerebral branches. IMPRESSION: 1. Patchy acute infarction in the posterior division left MCA territory. High-grade narrowing at the left M1 segment since May 2023 comparison. The interface between normal and stenotic vessel is fairly abrupt and this may reflect recent, partial thrombosis. 2. Premature chronic small vessel ischemia and intracranial atherosclerosis. Electronically Signed   By: Tiburcio Pea M.D.   On: 01/05/2023 08:34   CT Head Wo Contrast  Result Date: 01/05/2023 CLINICAL DATA:  Gait instability and slurred speech, initial encounter EXAM: CT HEAD WITHOUT CONTRAST TECHNIQUE: Contiguous axial images were obtained from the base of the skull through the vertex without intravenous contrast. RADIATION DOSE REDUCTION: This exam was performed according to the departmental dose-optimization program which includes automated exposure control, adjustment of the mA and/or kV according to patient size and/or use of iterative reconstruction technique. COMPARISON:  MRI from 12/08/2021 FINDINGS: Brain: There are patchy areas of decreased attenuation identified left  temporal lobe posteriorly, deep white matter in the right frontal lobe as well as patchy areas in the left parietal lobe. These all represent acute to subacute ischemia. MRI would be helpful for further evaluation. No hemorrhage is noted. Vascular: No hyperdense vessel or unexpected calcification. Skull: Normal. Negative for fracture or focal lesion. Sinuses/Orbits: No acute finding. Other: None. IMPRESSION: Patchy multifocal areas of decreased attenuation are noted consistent with acute to subacute ischemia. MRI would be helpful for further evaluation. Electronically Signed   By: Alcide Clever M.D.   On: 01/05/2023 00:28    Microbiology: Results for orders placed or performed in visit on 03/07/20  Novel Coronavirus, NAA ([facility])     Status: Abnormal   Collection Time: 03/07/20 11:54 AM   Specimen: Nasopharyngeal(NP) swabs in vial transport medium   Nasopharynge  Screenin  Result Value Ref Range Status   SARS-CoV-2, NAA Detected (A) Not Detected Final    Comment: Patients who have a positive COVID-19 test result may now have treatment options. Treatment options are available for patients with mild to moderate symptoms and for hospitalized patients. Visit our website at [email or web address] for resources and information. This nucleic acid amplification test was developed and its performance characteristics determined by [employer]. Nucleic acid amplification tests include RT-PCR and TMA. This test has not been FDA cleared or approved. This test has been authorized by FDA under an Emergency Use Authorization (EUA). This test is only authorized for the duration of time the declaration that circumstances exist justifying the authorization of the emergency use of in vitro diagnostic tests for detection of SARS-CoV-2 virus and/or diagnosis of COVID-19 infection under section 564(b)(1) of the Act, 21 U.S.C. 604VWU-9(W) (1), unless the authorization is terminated or  revoked sooner. When diagnostic testing is negativ e, the possibility of a false negative result should be considered in the context of a patient's recent exposures and the presence of clinical signs and symptoms consistent with COVID-19. An individual without symptoms of COVID-19 and who is not shedding SARS-CoV-2 virus would  expect to have a negative (not detected) result in this assay.   SARS-COV-2, NAA 2 DAY TAT     Status: None   Collection Time: 03/07/20 11:54 AM   Nasopharynge  Screenin  Result Value Ref Range Status   SARS-CoV-2, NAA 2 DAY TAT Performed  Final    Labs: CBC: Recent Labs  Lab 01/04/23 2340  WBC 12.5*  NEUTROABS 10.3*  HGB 15.1  HCT 46.5  MCV 84.7  PLT 217   Basic Metabolic Panel: Recent Labs  Lab 01/04/23 2340  NA 130*  K 3.7  CL 98  CO2 22  GLUCOSE 235*  BUN 9  CREATININE 0.77  CALCIUM 9.2   Liver Function Tests: Recent Labs  Lab 01/04/23 2340  AST 25  ALT 20  ALKPHOS 67  BILITOT 0.7  PROT 8.8*  ALBUMIN 4.2   CBG: Recent Labs  Lab 01/05/23 1140 01/05/23 1617 01/05/23 2142 01/06/23 0752 01/06/23 1110  GLUCAP 199* 126* 158* 163* 81    Discharge time spent: greater than 30 minutes.  Signed: Kendell Bane, MD [facility] 01/06/2023

## 2023-01-06 NOTE — ED Triage Notes (Signed)
Pt left AMA from room 307 a short while ago after recent admission for stroke. Per family present, pt left without receiving prescriptions for meds and/or follow-up recommendations and they do not know what to do for him at home. Pt denies confusion and seems much more steady on his feet since his arrival to ED a few days ago.

## 2023-01-06 NOTE — Progress Notes (Signed)
Initial Nutrition Assessment  DOCUMENTATION CODES:   Not applicable  INTERVENTION:  Will order double protein portions at meals.  Provide multivitamin with minerals daily, folic acid 1 mg daily, and thiamine 100 mg daily.  Recommend obtaining measured weight.  NUTRITION DIAGNOSIS:   Increased nutrient needs related to  (risk for inadequate intake of micronutrients) as evidenced by estimated needs (hx EtOH abuse).  GOAL:   Patient will meet greater than or equal to 90% of their needs  MONITOR:   PO intake, Supplement acceptance, Labs, Weight trends, I & O's  REASON FOR ASSESSMENT:   Malnutrition Screening Tool    ASSESSMENT:   57 year old male with PMHx of prior stroke in 2023, HTN, DM, dyslipidemia, polysubstance abuse (EtOH, cocaine, THC) admitted with acute CVA left MCA territory/associated high-grade narrowing left M1 segment.  6/18: pt passed [facility] swallow screen, diet advanced to carbohydrate modified  Patient screened for Malnutrition Screening Tool (2.0 for wt loss question and 0 for appetite question). RD working remotely. Attempted to call patient over the phone, but patient did not answer. Per review of chart pt ate 75-100% of meals yesterday and 100% of breakfast today. Will order double protein portions at meals to ensure pt receiving adequate protein. Pt would also benefit from starting multivitamin with minerals daily, folic acid, and thiamine daily in setting of hx of EtOH abuse.  Per review of weight history in chart pt was 86.2 kg on 08/03/2018 and 79.4 kg on 12/08/21. That is weight loss of 6.8 kg or 7.9% weight, but unsure time period weight loss might have occurred. Admission weight is also documented as exactly 79.4 kg (175 lbs) so unsure if this is a true measured weight. Recommend obtaining measured weight.  Medications reviewed and include: aspirin, Lipitor, Plavix, Lovenox, glipizide, Novolog 0-5 units at bedtime, Novolog 0-9 units TID, metformin  Labs  reviewed: CBG 81-163. On 6/17 Sodium 130 HgbA1c 5.9 on 01/04/23  UOP: none documented in previous 24 hours  I/O: +720 mL since admission  NUTRITION - FOCUSED PHYSICAL EXAM:  Unable to complete at this time as RD is working remotely.  Diet Order:   Diet Order             Diet Carb Modified Fluid consistency: Thin; Room service appropriate? Yes  Diet effective now                  EDUCATION NEEDS:   No education needs have been identified at this time  Skin:  Skin Assessment: Reviewed RN Assessment  Last BM:  01/03/23 per chart  Height:   Ht Readings from Last 1 Encounters:  01/04/23 5\' 10"  (1.778 m)   Weight:   Wt Readings from Last 1 Encounters:  01/04/23 79.4 kg   Ideal Body Weight:  75.5 kg  BMI:  Body mass index is 25.11 kg/m.  Estimated Nutritional Needs:   Kcal:  2000-2200  Protein:  100-110 grams  Fluid:  2-2.2 L/day  Letta Median, MS, RD, LDN, CNSC Pager number available on Amion

## 2023-01-06 NOTE — Telephone Encounter (Addendum)
Received referral from [facility] on 01/05/23 to Care connect. Stated was uninsured and referred to Care Connect. EPIC shows [employer] active insurance and confirmed by [employer] that insurance is active. Care Connect is program for uninsured.    Noted 01/06/23 afternoon that Mr. Messmer had left AMA then was Discharged from ER today. Attempted call, no answer and was unable to leave a message.  Will attempt again 01/07/23 and provide Mr. Bruzek with providers in area that are accepting new insured patients.   Francee Nodal RN [employer]

## 2023-01-06 NOTE — Progress Notes (Signed)
*  PRELIMINARY RESULTS* Echocardiogram 2D Echocardiogram has been performed.  Stacey Drain 01/06/2023, 11:12 AM

## 2023-01-06 NOTE — Evaluation (Signed)
Speech Language Pathology Evaluation Patient Details Name: Cameron [name] MRN: [id number] DOB: Aug 11, 1965 Today's Date: 01/06/2023 Time: 4098-1191 SLP Time Calculation (min) (ACUTE ONLY): 40 min  Problem List:  Patient Active Problem List   Diagnosis Date Noted   CVA (cerebral vascular accident) (HCC) 01/05/2023   HTN (hypertension) 12/08/2021   Acute ischemic stroke (HCC) 12/08/2021   Uncontrolled type 2 diabetes mellitus with hyperglycemia, without long-term current use of insulin (HCC) 12/08/2021   Hyperlipidemia 12/08/2021   Tobacco abuse 12/08/2021   Alcohol use 12/08/2021   Cervical spine degeneration 12/08/2021   Hyponatremia 12/08/2021   Past Medical History:  Past Medical History:  Diagnosis Date   Chronic shoulder pain    Hypertension    Stroke [facility])    Past Surgical History: History reviewed. No pertinent surgical history. HPI:  Cameron [name] is a 57 y.o. male with medical history significant of prior stroke in 2023, hypertension, diabetes mellitus not on insulin, dyslipidemia, polysubstance abuse that includes alcohol, cocaine and THC.  Patient presented to the ER after 4-5 days of having strokelike symptoms that involve difficulty speaking, difficulty walking and left-sided weakness.  Upon presentation to the triage area he was confused had slurred speech and was having difficulty walking he admitted to medication noncompliance. MRI reveals "Patchy acute infarction in the posterior division left MCA  territory.   "   Assessment / Plan / Recommendation Clinical Impression  Speech language evaluation completed 01/07/23 (delayed documentation). Pt presents with moderate to severe expressive aphasia and mild receptive aphasia. Pt followed one step commands with 90-100% accuracy and 2-3 step commands with 50-75% accuracy (note frustration with attempting to follow commands). Receptive language with conversation seems largely intact. Pt named 1/5 objects and with sentence  completion named 0/5 words. Pt was unable to say his name on command but with cues did say his full name eventually. Pt was unable to repeat sentences but could repeat single words. Pt answered inference questions regarding a brief passage with 75% accuracy again indicating comprehension to be largely intact. Biographical y/n answered with90% accuracy. Pt was able to count to 10 and say the months of the year with support and phonemic cues. Note some successful automatic phrases but a large number of neologisms and Pt was not always aware of errors. Pt did repeat words that were provided by SLP on occasion but still would report "that's not the word". Pt could identify pictures on a communication board. Pt will benefit from ongoing ST for aphasia in acute setting and recommend OP ST for more in depth evaluation and treatment of aphasia. Thank you for this referral,    SLP Assessment  SLP Recommendation/Assessment: Patient needs continued Speech Lanaguage Pathology Services SLP Visit Diagnosis: Aphasia (R47.01)    Recommendations for follow up therapy are one component of a multi-disciplinary discharge planning process, led by the attending physician.  Recommendations may be updated based on patient status, additional functional criteria and insurance authorization.    Follow Up Recommendations  Outpatient SLP          Frequency and Duration min 2x/week  2 weeks      SLP Evaluation Cognition  Overall Cognitive Status: Difficult to assess       Comprehension  Auditory Comprehension Overall Auditory Comprehension: Impaired Yes/No Questions: Impaired Basic Biographical Questions: 76-100% accurate Complex Questions: 50-74% accurate Commands: Impaired One Step Basic Commands: 75-100% accurate Two Step Basic Commands: 50-74% accurate Multistep Basic Commands: 25-49% accurate Conversation: Simple EffectiveTechniques: Pausing  Expression Expression Primary Mode of Expression:  Verbal Verbal Expression Overall Verbal Expression: Impaired Initiation: Impaired Automatic Speech: Name;Social Response;Counting;Month of year Level of Generative/Spontaneous Verbalization: Word Repetition: Impaired Level of Impairment: Word level Naming: Impairment Responsive: 0-25% accurate Confrontation: Impaired Convergent: 0-24% accurate Divergent: 0-24% accurate Verbal Errors: Neologisms;Aware of errors;Not aware of errors Effective Techniques: Open ended questions Non-Verbal Means of Communication: Communication board Written Expression Dominant Hand: Right Written Expression: Exceptions to [facility] Self Formulation Ability: Word   Oral / Motor  Oral Motor/Sensory Function Overall Oral Motor/Sensory Function: Within functional limits Motor Speech Overall Motor Speech: Impaired Motor Planning: Impaired Level of Impairment: Word Motor Speech Errors: Inconsistent Effective Techniques: Slow rate;Pause           Loriana Samad H. Romie Levee, CCC-SLP Speech Language Pathologist  Georgetta Haber 01/06/2023, 8:43 AM

## 2023-01-06 NOTE — Discharge Instructions (Addendum)
You were prescribed medications earlier that was sent to your pharmacy of record.  Please take as directed every day.  I have listed the neurology group's in [place] for you.  Please contact one of the groups listed to arrange follow-up appointment.

## 2023-01-06 NOTE — ED Notes (Signed)
Pt ambulated to the BR without assistance. 

## 2023-01-08 NOTE — ED Provider Notes (Signed)
[place] [facility] AT [facility] Provider Note   CSN: [id number] Arrival date & time: 01/06/23  1238     History {Add pertinent medical, surgical, social history, OB history to HPI:1} Chief Complaint  Patient presents with   Altered Mental Status    Cameron [name] is a 57 y.o. male.   Altered Mental Status      Home Medications Prior to Admission medications   Medication Sig Start Date End Date Taking? Authorizing Provider  atorvastatin (LIPITOR) 80 MG tablet Take 1 tablet (80 mg total) by mouth daily. 01/06/23 01/06/24 Yes Shahmehdi, Gemma Payor, MD  aspirin 81 MG chewable tablet Chew 1 tablet (81 mg total) by mouth daily. Patient not taking: Reported on 01/05/2023 12/10/21   Catarina Hartshorn, MD  blood glucose meter kit and supplies KIT Dispense based on patient and insurance preference. Use up to four times daily as directed. 12/09/21   Catarina Hartshorn, MD  clopidogrel (PLAVIX) 75 MG tablet Take 1 tablet (75 mg total) by mouth daily. 01/06/23 02/05/23  Shahmehdi, Gemma Payor, MD  glipiZIDE (GLUCOTROL) 5 MG tablet Take 1 tablet (5 mg total) by mouth daily before breakfast. Patient not taking: Reported on 01/05/2023 12/09/21   Catarina Hartshorn, MD  lisinopril (ZESTRIL) 20 MG tablet Take 1 tablet (20 mg total) by mouth daily. Patient not taking: Reported on 01/05/2023 12/10/21   Catarina Hartshorn, MD  metFORMIN (GLUCOPHAGE) 500 MG tablet Take 1 tablet (500 mg total) by mouth 2 (two) times daily with a meal. Patient not taking: Reported on 01/05/2023 12/09/21   Catarina Hartshorn, MD      Allergies    Patient has no known allergies.    Review of Systems   Review of Systems  Physical Exam Updated Vital Signs BP (!) 143/91   Pulse 72   Temp 98.6 F (37 C) (Oral)   Resp (!) 30   Ht 5\' 10"  (1.778 m)   Wt 79.4 kg   SpO2 99%   BMI 25.11 kg/m  Physical Exam  ED Results / Procedures / Treatments   Labs (all labs ordered are listed, but only abnormal results are displayed) Labs Reviewed - No  data to display  EKG EKG Interpretation  Date/Time:  Wednesday January 06 2023 14:31:24 EDT Ventricular Rate:  64 PR Interval:  142 QRS Duration: 80 QT Interval:  379 QTC Calculation: 391 R Axis:   53 Text Interpretation: Sinus rhythm Confirmed by Vonita Moss [phone]) on 01/07/2023 2:08:20 PM  Radiology No results found.  Procedures Procedures  {Document cardiac monitor, telemetry assessment procedure when appropriate:1}  Medications Ordered in ED Medications - No data to display  ED Course/ Medical Decision Making/ A&P   {   Click here for ABCD2, HEART and other calculatorsREFRESH Note before signing :1}                          Medical Decision Making  ***  {Document critical care time when appropriate:1} {Document review of labs and clinical decision tools ie heart score, Chads2Vasc2 etc:1}  {Document your independent review of radiology images, and any outside records:1} {Document your discussion with family members, caretakers, and with consultants:1} {Document social determinants of health affecting pt's care:1} {Document your decision making why or why not admission, treatments were needed:1} Final Clinical Impression(s) / ED Diagnoses Final diagnoses:  History of ischemic left MCA stroke    Rx / DC Orders ED Discharge Orders  Ordered    atorvastatin (LIPITOR) 80 MG tablet  Daily        01/06/23 1321    clopidogrel (PLAVIX) 75 MG tablet  Daily        01/06/23 1321

## 2023-01-11 ENCOUNTER — Telehealth: Payer: Self-pay

## 2023-01-11 NOTE — Telephone Encounter (Signed)
Attempted follow up from [facility] referral, no answer, states he is unable to accept calls at this time.  Cameron [name] is still showing Active [employer] insurance and confirmed by [employer]. [facility] Connect referral was made by hospital Social worker, Care Connect program is for uninsured.   Attempt to call today to confirm with Cameron [name] he is aware of the insurance and about our program and determine if he would need other resources.  Will mail Care Connect information and a resource guide as this is 3 rd attempt to reach him.    Francee Nodal RN [employer]

## 2023-02-01 DIAGNOSIS — Z0001 Encounter for general adult medical examination with abnormal findings: Secondary | ICD-10-CM | POA: Diagnosis not present

## 2023-02-01 DIAGNOSIS — E1165 Type 2 diabetes mellitus with hyperglycemia: Secondary | ICD-10-CM | POA: Diagnosis not present

## 2023-02-01 DIAGNOSIS — E119 Type 2 diabetes mellitus without complications: Secondary | ICD-10-CM | POA: Diagnosis not present

## 2023-02-01 DIAGNOSIS — F17210 Nicotine dependence, cigarettes, uncomplicated: Secondary | ICD-10-CM | POA: Diagnosis not present

## 2023-02-01 DIAGNOSIS — E7849 Other hyperlipidemia: Secondary | ICD-10-CM | POA: Diagnosis not present

## 2023-02-01 DIAGNOSIS — I10 Essential (primary) hypertension: Secondary | ICD-10-CM | POA: Diagnosis not present

## 2023-02-01 DIAGNOSIS — I69954 Hemiplegia and hemiparesis following unspecified cerebrovascular disease affecting left non-dominant side: Secondary | ICD-10-CM | POA: Diagnosis not present

## 2023-02-01 DIAGNOSIS — F17200 Nicotine dependence, unspecified, uncomplicated: Secondary | ICD-10-CM | POA: Diagnosis not present

## 2023-02-03 ENCOUNTER — Other Ambulatory Visit (HOSPITAL_COMMUNITY): Payer: Self-pay | Admitting: Gerontology

## 2023-02-03 DIAGNOSIS — R059 Cough, unspecified: Secondary | ICD-10-CM

## 2023-02-03 DIAGNOSIS — I10 Essential (primary) hypertension: Secondary | ICD-10-CM | POA: Diagnosis not present

## 2023-02-03 DIAGNOSIS — E119 Type 2 diabetes mellitus without complications: Secondary | ICD-10-CM | POA: Diagnosis not present

## 2023-02-03 DIAGNOSIS — E7849 Other hyperlipidemia: Secondary | ICD-10-CM | POA: Diagnosis not present

## 2023-02-03 DIAGNOSIS — Z0001 Encounter for general adult medical examination with abnormal findings: Secondary | ICD-10-CM | POA: Diagnosis not present

## 2023-02-05 ENCOUNTER — Ambulatory Visit (HOSPITAL_COMMUNITY)
Admission: RE | Admit: 2023-02-05 | Discharge: 2023-02-05 | Disposition: A | Discharge status: Home / Self Care | Payer: 59 | Source: Ambulatory Visit | Attending: Gerontology | Admitting: Gerontology

## 2023-02-05 DIAGNOSIS — R0989 Other specified symptoms and signs involving the circulatory and respiratory systems: Secondary | ICD-10-CM | POA: Diagnosis not present

## 2023-02-05 DIAGNOSIS — R059 Cough, unspecified: Secondary | ICD-10-CM | POA: Diagnosis not present

## 2023-02-10 ENCOUNTER — Ambulatory Visit (INDEPENDENT_AMBULATORY_CARE_PROVIDER_SITE_OTHER): Payer: 59 | Admitting: Neurology

## 2023-02-10 ENCOUNTER — Encounter: Payer: Self-pay | Admitting: Neurology

## 2023-02-10 VITALS — BP 147/93 | HR 86 | Ht 70.0 in | Wt 156.0 lb

## 2023-02-10 DIAGNOSIS — R413 Other amnesia: Secondary | ICD-10-CM | POA: Diagnosis not present

## 2023-02-10 DIAGNOSIS — I6602 Occlusion and stenosis of left middle cerebral artery: Secondary | ICD-10-CM | POA: Diagnosis not present

## 2023-02-10 DIAGNOSIS — Z8673 Personal history of transient ischemic attack (TIA), and cerebral infarction without residual deficits: Secondary | ICD-10-CM

## 2023-02-10 DIAGNOSIS — G3184 Mild cognitive impairment, so stated: Secondary | ICD-10-CM

## 2023-02-10 MED ORDER — CLOPIDOGREL BISULFATE 75 MG PO TABS
75.0000 mg | ORAL_TABLET | Freq: Every day | ORAL | 2 refills | Status: DC
Start: 2023-02-10 — End: 2023-04-28

## 2023-02-10 NOTE — Progress Notes (Signed)
[facility] [address] [place]. [place] [address] [phone]       OFFICE CONSULT NOTE  [name] Date of Birth:  [birth year] Medical Record Number:  [id number]   Referring MD:  Flossie Dibble  Reason for Referral: Stroke  HPI: [name] is a 57 year old African-American male with past medical history significant for diabetes, hypertension, hyperlipidemia, polysubstance abuse including cocaine, marijuana, cigarettes and alcohol.  Initially presented on 12/08/2021 to [facility] with 5-day history of left-sided weakness, numbness and incoordination.  MRI scan showed right internal capsule/corona radiata infarct.  MRI of the brain showed no large vessel stenosis or occlusion.  MRI cervical spine showed moderate stenosis and mild to moderate left-sided foraminal narrowing at C3-4 mild spinal stenosis at C4-5.  Patient had some residual left-sided weakness but he returned on 01/05/2023 with new complaints of confusion, slurred speech and dizziness and gait ataxia.  The symptoms also were ongoing for several days prior to ER visit.  Urine drug screen was positive for cocaine and marijuana and alcohol level was also elevated.  MRI confirmed left MCA patchy embolic infarct.  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  Carotid ultrasound showed less than 50% proximal left ICA stenosis.  LDL cholesterol was 74 mg percent.  Hemoglobin A1c was 5.9.  Echocardiogram showed ejection fraction of 60 to 65%.  Patient was advised to take dual antiplatelet therapy aspirin and Plavix for 3 months followed by aspirin alone and aggressive risk factor modification.  He was counseled to quit cigarettes, alcohol, cocaine and marijuana.  He is seen for follow-up today in the office accompanied by his sister.  He states he has been compliant with his medications but states he has not been taking Plavix.  His blood pressure is well-controlled and today  it is slightly elevated 147/93.  His tolerating aspirin well without bruising or bleeding and Lipitor without muscle aches and pains.  He states his sugars are doing well.  He continues to have poor balance and occasionally gets dizzy and off balance.  His main complaint is that his short-term memory and cognitive difficulties.  .  He states his brain cannot think quickly and he has had trouble making new memories and handling his affairs.  Forgets appointments and recent information.Marland Kitchen  He is states his brain is slow in thinking.  He has a upcoming court date and feels he will not be able to follow the court proceedings well due to his memory and cognitive difficulties.  He denies any new recurrent stroke or TIA symptoms.  He admits to still doing polysubstance abuse and realizes that he needs to change his lifestyle and quit doing so  ROS:   14 system review of systems is positive for memory difficulties, dizziness, imbalance, slow thinking all other systems negative  PMH:  Past Medical History:  Diagnosis Date   Chronic shoulder pain    Hypertension    Stroke [facility])     Social History:  Social History   Socioeconomic History   Marital status: Legally Separated    Spouse name: Not on file   Number of children: Not on file   Years of education: Not on file   Highest education level: Not on file  Occupational History   Not on file  Tobacco Use   Smoking status: Every Day    Current packs/day: 0.50    Types: Cigarettes   Smokeless tobacco: Never  Substance and Sexual  Activity   Alcohol use: Yes   Drug use: Yes    Types: Cocaine, Marijuana    Comment: last use 01/05/2023   Sexual activity: Not on file  Other Topics Concern   Not on file  Social History Narrative   Not on file   Social Determinants of Health   Financial Resource Strain: Not on file  Food Insecurity: No Food Insecurity (01/05/2023)   Hunger Vital Sign    Worried About Running Out of Food in the Last Year: Never  true    Ran Out of Food in the Last Year: Never true  Transportation Needs: No Transportation Needs (01/05/2023)   PRAPARE - Administrator, Civil Service (Medical): No    Lack of Transportation (Non-Medical): No  Physical Activity: Not on file  Stress: Not on file  Social Connections: Not on file  Intimate Partner Violence: Not At Risk (01/05/2023)   Humiliation, Afraid, Rape, and Kick questionnaire    Fear of Current or Ex-Partner: No    Emotionally Abused: No    Physically Abused: No    Sexually Abused: No    Medications:   Current Outpatient Medications on File Prior to Visit  Medication Sig Dispense Refill   aspirin 81 MG chewable tablet Chew 1 tablet (81 mg total) by mouth daily.     atorvastatin (LIPITOR) 80 MG tablet Take 1 tablet (80 mg total) by mouth daily. 30 tablet 11   blood glucose meter kit and supplies KIT Dispense based on patient and insurance preference. Use up to four times daily as directed. 1 each 1   glipiZIDE (GLUCOTROL) 5 MG tablet Take 1 tablet (5 mg total) by mouth daily before breakfast. 30 tablet 1   lisinopril (ZESTRIL) 20 MG tablet Take 1 tablet (20 mg total) by mouth daily. 30 tablet 1   metFORMIN (GLUCOPHAGE) 500 MG tablet Take 1 tablet (500 mg total) by mouth 2 (two) times daily with a meal. 60 tablet 1   No current facility-administered medications on file prior to visit.    Allergies:  No Known Allergies  Physical Exam General: well developed, well nourished middle-aged African-American male, seated, in no evident distress Head: head normocephalic and atraumatic.   Neck: supple with no carotid or supraclavicular bruits Cardiovascular: regular rate and rhythm, no murmurs Musculoskeletal: no deformity Skin:  no rash/petichiae Vascular:  Normal pulses all extremities  Neurologic Exam Mental Status: Awake and fully alert. Oriented to place and time. Recent and remote memory diminished. Attention span, concentration and fund of  knowledge diminished. Mood and affect appropriate.  MMSE score 22/30.  Diminished recall 2/3.  Able to name 11 animals which can walk on 4 legs.  Clock drawing 4/4.  Geriatric depression scale 6 not depressed Cranial Nerves: Fundoscopic exam reveals sharp disc margins. Pupils equal, briskly reactive to light. Extraocular movements full without nystagmus. Visual fields full to confrontation. Hearing intact. Facial sensation intact.  Subtle left facial asymmetry, tongue, palate moves normally and symmetrically.  Motor: Normal bulk and tone. Normal strength in all tested extremity muscles.  Diminished fine finger movements on the left.  Orbits right over left upper extremity. Sensory.: intact to touch , pinprick , position and vibratory sensation.  Coordination: Rapid alternating movements normal in all extremities. Finger-to-nose and heel-to-shin performed accurately bilaterally. Gait and Station: Arises from chair without difficulty. Stance is normal. Gait demonstrates normal stride length and balance . Able to heel, toe and tandem walk with moderate difficulty.  Reflexes: 1+ and  symmetric. Toes downgoing.   NIHSS  1 Modified Rankin 2    02/10/2023    4:38 PM  MMSE - Mini Mental State Exam  Orientation to time 5  Orientation to Place 4  Registration 3  Attention/ Calculation 1  Recall 1  Language- name 2 objects 2  Language- repeat 1  Language- follow 3 step command 3  Language- read & follow direction 1  Write a sentence 1  Copy design 0  Total score 22      ASSESSMENT: 57 year old African-American male with right subcortical infarct in May 2023 and left MCA branch infarct in June 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  He has mild residual gait and balance difficulties as well as mild cognitive impairment poststroke     PLAN:I had a long d/w  patient and his sister about his recent bilateral strokes, left middle cerebral artery stenosis, cocaine use, cigarette and marijuana abuse, risk for recurrent stroke/TIAs, personally independently reviewed imaging studies and stroke evaluation results and answered questions.Continue aspirin 81 mg daily and Plavix 75 mg daily for 3 months followed by aspirin alone for secondary stroke prevention and maintain strict control of hypertension with blood pressure goal below 130/90, diabetes with hemoglobin A1c goal below 6.5% and lipids with LDL cholesterol goal below 70 mg/dL. I also advised the patient to eat a healthy diet with plenty of whole grains, cereals, fruits and vegetables, exercise regularly and maintain ideal body weight check transcranial Doppler to follow left MCA stenosis.  Patient was counseled to quit smoking cigarettes, marijuana and cocaine.  He is also having mild cognitive impairment.  I recommend he do mentally challenging activities like solving crossword puzzles, playing bridge and sudoku.  We also discussed memory compensation strategies.  Followup in the future with my nurse practitioner in 3 months or call earlier if necessary Greater than 50% time during this 60-minute consultation visit was spent on counseling and coordination of care about his bilateral strokes and intracranial stenosis and polysubstance abuse and mild cognitive impairment and answering questions Delia Heady, MD Note: This document was prepared with digital dictation and possible smart phrase technology. Any transcriptional errors that result from this process are unintentional.

## 2023-02-10 NOTE — Patient Instructions (Signed)
I had a long d/w patient and his sister about his recent bilateral strokes, left middle cerebral artery stenosis, cocaine use, cigarette and marijuana abuse, risk for recurrent stroke/TIAs, personally independently reviewed imaging studies and stroke evaluation results and answered questions.Continue aspirin 81 mg daily and Plavix 75 mg daily for 3 months followed by aspirin alone for secondary stroke prevention and maintain strict control of hypertension with blood pressure goal below 130/90, diabetes with hemoglobin A1c goal below 6.5% and lipids with LDL cholesterol goal below 70 mg/dL. I also advised the patient to eat a healthy diet with plenty of whole grains, cereals, fruits and vegetables, exercise regularly and maintain ideal body weight check transcranial Doppler to follow left MCA stenosis.  Patient was counseled to quit smoking cigarettes, marijuana and cocaine.  He is also having mild cognitive impairment.  I recommend he do mentally challenging activities like solving crossword puzzles, playing bridge and sudoku.  We also discussed memory compensation strategies.  Followup in the future with my nurse practitioner in 3 months or call earlier if necessary  Memory Compensation Strategies  Use "WARM" strategy.  W= write it down  A= associate it  R= repeat it  M= make a mental note  2.   You can keep a Glass blower/designer.  Use a 3-ring notebook with sections for the following: calendar, important names and phone numbers,  medications, doctors' names/phone numbers, lists/reminders, and a section to journal what you did  each day.   3.    Use a calendar to write appointments down.  4.    Write yourself a schedule for the day.  This can be placed on the calendar or in a separate section of the Memory Notebook.  Keeping a  regular schedule can help memory.  5.    Use medication organizer with sections for each day or morning/evening pills.  You may need help loading it  6.    Keep a basket, or  pegboard by the door.  Place items that you need to take out with you in the basket or on the pegboard.  You may also want to  include a message board for reminders.  7.    Use sticky notes.  Place sticky notes with reminders in a place where the task is performed.  For example: " turn off the  stove" placed by the stove, "lock the door" placed on the door at eye level, " take your medications" on  the bathroom mirror or by the place where you normally take your medications.  8.    Use alarms/timers.  Use while cooking to remind yourself to check on food or as a reminder to take your medicine, or as a  reminder to make a call, or as a reminder to perform another task, etc.  Stroke Prevention Some medical conditions and behaviors can lead to a higher chance of having a stroke. You can help prevent a stroke by eating healthy, exercising, not smoking, and managing any medical conditions you have. Stroke is a leading cause of functional impairment. Primary prevention is particularly important because a majority of strokes are first-time events. Stroke changes the lives of not only those who experience a stroke but also their family and other caregivers. How can this condition affect me? A stroke is a medical emergency and should be treated right away. A stroke can lead to brain damage and can sometimes be life-threatening. If a person gets medical treatment right away, there is a better chance of  surviving and recovering from a stroke. What can increase my risk? The following medical conditions may increase your risk of a stroke: Cardiovascular disease. High blood pressure (hypertension). Diabetes. High cholesterol. Sickle cell disease. Blood clotting disorders (hypercoagulable state). Obesity. Sleep disorders (obstructive sleep apnea). Other risk factors include: Being older than age 74. Having a history of blood clots, stroke, or mini-stroke (transient ischemic attack, TIA). Genetic factors,  such as race, ethnicity, or a family history of stroke. Smoking cigarettes or using other tobacco products. Taking birth control pills, especially if you also use tobacco. Heavy use of alcohol or drugs, especially cocaine and methamphetamine. Physical inactivity. What actions can I take to prevent this? Manage your health conditions High cholesterol levels. Eating a healthy diet is important for preventing high cholesterol. If cholesterol cannot be managed through diet alone, you may need to take medicines. Take any prescribed medicines to control your cholesterol as told by your health care provider. Hypertension. To reduce your risk of stroke, try to keep your blood pressure below 130/80. Eating a healthy diet and exercising regularly are important for controlling blood pressure. If these steps are not enough to manage your blood pressure, you may need to take medicines. Take any prescribed medicines to control hypertension as told by your health care provider. Ask your health care provider if you should monitor your blood pressure at home. Have your blood pressure checked every year, even if your blood pressure is normal. Blood pressure increases with age and some medical conditions. Diabetes. Eating a healthy diet and exercising regularly are important parts of managing your blood sugar (glucose). If your blood sugar cannot be managed through diet and exercise, you may need to take medicines. Take any prescribed medicines to control your diabetes as told by your health care provider. Get evaluated for obstructive sleep apnea. Talk to your health care provider about getting a sleep evaluation if you snore a lot or have excessive sleepiness. Make sure that any other medical conditions you have, such as atrial fibrillation or atherosclerosis, are managed. Nutrition Follow instructions from your health care provider about what to eat or drink to help manage your health condition. These  instructions may include: Reducing your daily calorie intake. Limiting how much salt (sodium) you use to 1,500 milligrams (mg) each day. Using only healthy fats for cooking, such as olive oil, canola oil, or sunflower oil. Eating healthy foods. You can do this by: Choosing foods that are high in fiber, such as whole grains, and fresh fruits and vegetables. Eating at least 5 servings of fruits and vegetables a day. Try to fill one-half of your plate with fruits and vegetables at each meal. Choosing lean protein foods, such as lean cuts of meat, poultry without skin, fish, tofu, beans, and nuts. Eating low-fat dairy products. Avoiding foods that are high in sodium. This can help lower blood pressure. Avoiding foods that have saturated fat, trans fat, and cholesterol. This can help prevent high cholesterol. Avoiding processed and prepared foods. Counting your daily carbohydrate intake.  Lifestyle If you drink alcohol: Limit how much you have to: 0-1 drink a day for women who are not pregnant. 0-2 drinks a day for men. Know how much alcohol is in your drink. In the [place] one drink equals one 12 oz bottle of beer ( ), one 5 oz glass of wine ( ), or one 1 oz glass of hard liquor (44mL). Do not use any products that contain nicotine or tobacco. These products include cigarettes, chewing tobacco,  and vaping devices, such as e-cigarettes. If you need help quitting, ask your health care provider. Avoid secondhand smoke. Do not use drugs. Activity  Try to stay at a healthy weight. Get at least 30 minutes of exercise on most days, such as: Fast walking. Biking. Swimming. Medicines Take over-the-counter and prescription medicines only as told by your health care provider. Aspirin or blood thinners (antiplatelets or anticoagulants) may be recommended to reduce your risk of forming blood clots that can lead to stroke. Avoid taking birth control pills. Talk to your health care provider about  the risks of taking birth control pills if: You are over 71 years old. You smoke. You get very bad headaches. You have had a blood clot. Where to find more information American Stroke Association: www.strokeassociation.org Get help right away if: You or a loved one has any symptoms of a stroke. "BE FAST" is an easy way to remember the main warning signs of a stroke: B - Balance. Signs are dizziness, sudden trouble walking, or loss of balance. E - Eyes. Signs are trouble seeing or a sudden change in vision. F - Face. Signs are sudden weakness or numbness of the face, or the face or eyelid drooping on one side. A - Arms. Signs are weakness or numbness in an arm. This happens suddenly and usually on one side of the body. S - Speech. Signs are sudden trouble speaking, slurred speech, or trouble understanding what people say. T - Time. Time to call emergency services. Write down what time symptoms started. You or a loved one has other signs of a stroke, such as: A sudden, severe headache with no known cause. Nausea or vomiting. Seizure. These symptoms may represent a serious problem that is an emergency. Do not wait to see if the symptoms will go away. Get medical help right away. Call your local emergency services (911 in the [place] Do not drive yourself to the hospital. Summary You can help to prevent a stroke by eating healthy, exercising, not smoking, limiting alcohol intake, and managing any medical conditions you may have. Do not use any products that contain nicotine or tobacco. These include cigarettes, chewing tobacco, and vaping devices, such as e-cigarettes. If you need help quitting, ask your health care provider. Remember "BE FAST" for warning signs of a stroke. Get help right away if you or a loved one has any of these signs. This information is not intended to replace advice given to you by your health care provider. Make sure you discuss any questions you have with your health care  provider. Document Revised: 06/08/2022 Document Reviewed: 06/08/2022 Elsevier Patient Education  2024 [employer].

## 2023-02-16 ENCOUNTER — Ambulatory Visit (HOSPITAL_COMMUNITY): Payer: 59

## 2023-02-22 ENCOUNTER — Ambulatory Visit (HOSPITAL_COMMUNITY): Admission: RE | Admit: 2023-02-22 | Payer: 59 | Source: Ambulatory Visit

## 2023-03-05 ENCOUNTER — Ambulatory Visit (HOSPITAL_COMMUNITY)
Admission: RE | Admit: 2023-03-05 | Discharge: 2023-03-05 | Disposition: A | Discharge status: Home / Self Care | Payer: 59 | Source: Ambulatory Visit | Attending: Neurology | Admitting: Neurology

## 2023-03-05 DIAGNOSIS — I6602 Occlusion and stenosis of left middle cerebral artery: Secondary | ICD-10-CM | POA: Diagnosis not present

## 2023-03-22 NOTE — Progress Notes (Signed)
Kindly inform the patient that transcranial Doppler study confirms moderate narrowing of the middle cerebral artery on the left in the front.

## 2023-03-24 ENCOUNTER — Telehealth: Payer: Self-pay | Admitting: Anesthesiology

## 2023-03-24 NOTE — Telephone Encounter (Signed)
-----   Message from Delia Heady sent at 03/22/2023  8:27 PM EDT ----- Joneen Roach inform the patient that transcranial Doppler study confirms moderate narrowing of the middle cerebral artery on the left in the front.

## 2023-03-25 ENCOUNTER — Telehealth: Payer: Self-pay

## 2023-03-25 DIAGNOSIS — I10 Essential (primary) hypertension: Secondary | ICD-10-CM | POA: Diagnosis not present

## 2023-03-25 DIAGNOSIS — E119 Type 2 diabetes mellitus without complications: Secondary | ICD-10-CM | POA: Diagnosis not present

## 2023-03-25 DIAGNOSIS — I69954 Hemiplegia and hemiparesis following unspecified cerebrovascular disease affecting left non-dominant side: Secondary | ICD-10-CM | POA: Diagnosis not present

## 2023-03-25 DIAGNOSIS — E7849 Other hyperlipidemia: Secondary | ICD-10-CM | POA: Diagnosis not present

## 2023-03-25 NOTE — Telephone Encounter (Signed)
-----   Message from Delia Heady sent at 03/22/2023  8:27 PM EDT ----- Cameron [name] inform the patient that transcranial Doppler study confirms moderate narrowing of the middle cerebral artery on the left in the front.

## 2023-03-25 NOTE — Telephone Encounter (Signed)
Called patient to informed him of his CT Angiogram results. Patient states he been feeling good and keeping with his Blood Pressure. Pt verbalized understanding. Pt had no questions at this time but was encouraged to call back if questions arise.

## 2023-04-20 DIAGNOSIS — Z419 Encounter for procedure for purposes other than remedying health state, unspecified: Secondary | ICD-10-CM | POA: Diagnosis not present

## 2023-04-27 NOTE — Progress Notes (Unsigned)
[facility] [address] [place]. [place] [address] [phone]       OFFICE FOLLOW UP NOTE  [name] Date of Birth:  06-24-1966 Medical Record Number:  [id number]    Primary neurologist: Dr. Pearlean Brownie Reason for visit: Stroke follow-up    No chief complaint on file.      HPI:   Update 04/28/2023 JM: Patient returns for stroke follow-up.  Denies new stroke/TIA symptoms  Completed 3 months DAPT, remains on aspirin alone as well as atorvastatin Plavix *** Routinely follows with PCP for stroke risk factor management     History provided for reference purposes only Consult visit 02/10/2023 Dr. Pearlean Brownie: [name] is a 57 year old African-American male with past medical history significant for diabetes, hypertension, hyperlipidemia, polysubstance abuse including cocaine, marijuana, cigarettes and alcohol.  Initially presented on 12/08/2021 to [facility] with 5-day history of left-sided weakness, numbness and incoordination.  MRI scan showed right internal capsule/corona radiata infarct.  MRI of the brain showed no large vessel stenosis or occlusion.  MRI cervical spine showed moderate stenosis and mild to moderate left-sided foraminal narrowing at C3-4 mild spinal stenosis at C4-5.  Patient had some residual left-sided weakness but he returned on 01/05/2023 with new complaints of confusion, slurred speech and dizziness and gait ataxia.  The symptoms also were ongoing for several days prior to ER visit.  Urine drug screen was positive for cocaine and marijuana and alcohol level was also elevated.  MRI confirmed left MCA patchy embolic infarct.  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  Carotid ultrasound showed less than 50% proximal left ICA stenosis.  LDL cholesterol was 74 mg percent.  Hemoglobin A1c was 5.9.  Echocardiogram showed ejection fraction of 60 to 65%.  Patient was advised to take dual antiplatelet  therapy aspirin and Plavix for 3 months followed by aspirin alone and aggressive risk factor modification.  He was counseled to quit cigarettes, alcohol, cocaine and marijuana.  He is seen for follow-up today in the office accompanied by his sister.  He states he has been compliant with his medications but states he has not been taking Plavix.  His blood pressure is well-controlled and today it is slightly elevated 147/93.  His tolerating aspirin well without bruising or bleeding and Lipitor without muscle aches and pains.  He states his sugars are doing well.  He continues to have poor balance and occasionally gets dizzy and off balance.  His main complaint is that his short-term memory and cognitive difficulties.  .  He states his brain cannot think quickly and he has had trouble making new memories and handling his affairs.  Forgets appointments and recent information.Marland Kitchen  He is states his brain is slow in thinking.  He has a upcoming court date and feels he will not be able to follow the court proceedings well due to his memory and cognitive difficulties.  He denies any new recurrent stroke or TIA symptoms.  He admits to still doing polysubstance abuse and realizes that he needs to change his lifestyle and quit doing so  ROS:   14 system review of systems is positive for memory difficulties, dizziness, imbalance, slow thinking all other systems negative  PMH:  Past Medical History:  Diagnosis Date   Chronic shoulder pain    Hypertension    Stroke [facility])     Social History:  Social History   Socioeconomic History   Marital status: Legally Separated  Spouse name: Not on file   Number of children: Not on file   Years of education: Not on file   Highest education level: Not on file  Occupational History   Not on file  Tobacco Use   Smoking status: Every Day    Current packs/day: 0.50    Types: Cigarettes   Smokeless tobacco: Never  Substance and Sexual Activity   Alcohol use: Yes   Drug  use: Yes    Types: Cocaine, Marijuana    Comment: last use 01/05/2023   Sexual activity: Not on file  Other Topics Concern   Not on file  Social History Narrative   Not on file   Social Determinants of Health   Financial Resource Strain: Not on file  Food Insecurity: No Food Insecurity (01/05/2023)   Hunger Vital Sign    Worried About Running Out of Food in the Last Year: Never true    Ran Out of Food in the Last Year: Never true  Transportation Needs: No Transportation Needs (01/05/2023)   PRAPARE - Administrator, Civil Service (Medical): No    Lack of Transportation (Non-Medical): No  Physical Activity: Not on file  Stress: Not on file  Social Connections: Not on file  Intimate Partner Violence: Not At Risk (01/05/2023)   Humiliation, Afraid, Rape, and Kick questionnaire    Fear of Current or Ex-Partner: No    Emotionally Abused: No    Physically Abused: No    Sexually Abused: No    Medications:   Current Outpatient Medications on File Prior to Visit  Medication Sig Dispense Refill   aspirin 81 MG chewable tablet Chew 1 tablet (81 mg total) by mouth daily.     atorvastatin (LIPITOR) 80 MG tablet Take 1 tablet (80 mg total) by mouth daily. 30 tablet 11   blood glucose meter kit and supplies KIT Dispense based on patient and insurance preference. Use up to four times daily as directed. 1 each 1   clopidogrel (PLAVIX) 75 MG tablet Take 1 tablet (75 mg total) by mouth daily. 30 tablet 2   glipiZIDE (GLUCOTROL) 5 MG tablet Take 1 tablet (5 mg total) by mouth daily before breakfast. 30 tablet 1   lisinopril (ZESTRIL) 20 MG tablet Take 1 tablet (20 mg total) by mouth daily. 30 tablet 1   metFORMIN (GLUCOPHAGE) 500 MG tablet Take 1 tablet (500 mg total) by mouth 2 (two) times daily with a meal. 60 tablet 1   No current facility-administered medications on file prior to visit.    Allergies:  No Known Allergies  Physical Exam There were no vitals filed for this  visit. There is no height or weight on file to calculate BMI.  General: well developed, well nourished middle-aged African-American male, seated, in no evident distress Head: head normocephalic and atraumatic.   Neck: supple with no carotid or supraclavicular bruits Cardiovascular: regular rate and rhythm, no murmurs Musculoskeletal: no deformity Skin:  no rash/petichiae Vascular:  Normal pulses all extremities  Neurologic Exam Mental Status: Awake and fully alert. Oriented to place and time. Recent and remote memory diminished. Attention span, concentration and fund of knowledge diminished. Mood and affect appropriate.  MMSE score 22/30.  Diminished recall 2/3.  Able to name 11 animals which can walk on 4 legs.  Clock drawing 4/4.  Geriatric depression scale 6 not depressed Cranial Nerves: Pupils equal, briskly reactive to light. Extraocular movements full without nystagmus. Visual fields full to confrontation. Hearing intact. Facial sensation  intact.  Subtle left facial asymmetry, tongue, palate moves normally and symmetrically.  Motor: Normal bulk and tone. Normal strength in all tested extremity muscles.  Diminished fine finger movements on the left.  Orbits right over left upper extremity. Sensory.: intact to touch , pinprick , position and vibratory sensation.  Coordination: Rapid alternating movements normal in all extremities. Finger-to-nose and heel-to-shin performed accurately bilaterally. Gait and Station: Arises from chair without difficulty. Stance is normal. Gait demonstrates normal stride length and balance . Able to heel, toe and tandem walk with moderate difficulty.  Reflexes: 1+ and symmetric. Toes downgoing.       02/10/2023    4:38 PM  MMSE - Mini Mental State Exam  Orientation to time 5  Orientation to Place 4  Registration 3  Attention/ Calculation 1  Recall 1  Language- name 2 objects 2  Language- repeat 1  Language- follow 3 step command 3  Language- read &  follow direction 1  Write a sentence 1  Copy design 0  Total score 22      ASSESSMENT: 57 year old African-American male with right subcortical infarct in May 2023 and left MCA branch infarct in June 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  He has mild residual gait and balance difficulties as well as mild cognitive impairment poststroke     PLAN:   -Continue aspirin and atorvastatin for secondary stroke prevention measures -Continue close follow-up with PCP for aggressive stroke risk factor management including BP goal<130/90, HLD with LDL goal<70 and DM with A1c.<7  -Discussed importance of complete tobacco and street drug cessation -Advise limiting EtOH use to 1-2 drinks per day       I spent *** minutes of face-to-face and non-face-to-face time with patient.  This included previsit chart review, lab review, study review, order entry, electronic health record documentation, patient education and discussion regarding above diagnoses and treatment plan and answered all other questions to patient's satisfaction  Ihor Austin, [facility]  [facility] [address] [place], [place] [address]  Phone [phone] Fax [phone] Note: This document was prepared with digital dictation and possible smart phrase technology. Any transcriptional errors that result from this process are unintentional.

## 2023-04-28 ENCOUNTER — Encounter: Payer: Self-pay | Admitting: Adult Health

## 2023-04-28 ENCOUNTER — Ambulatory Visit (INDEPENDENT_AMBULATORY_CARE_PROVIDER_SITE_OTHER): Payer: 59 | Admitting: Adult Health

## 2023-04-28 VITALS — BP 126/75 | HR 70 | Ht 70.0 in | Wt 169.4 lb

## 2023-04-28 DIAGNOSIS — I69319 Unspecified symptoms and signs involving cognitive functions following cerebral infarction: Secondary | ICD-10-CM

## 2023-04-28 DIAGNOSIS — I63512 Cerebral infarction due to unspecified occlusion or stenosis of left middle cerebral artery: Secondary | ICD-10-CM | POA: Diagnosis not present

## 2023-04-28 NOTE — Patient Instructions (Addendum)
You will be called to start cognitive therapy at [facility] - please let me know if you do not hear from them by end of next week  If cognitive concerns persist, we can consider doing a neurocognitive evaluation   Continue to work on complete tobacco cessation   Continue aspirin 81 mg daily  and atorvastatin  for secondary stroke prevention  Continue to follow up with PCP regarding cholesterol, blood pressure and diabetes management  Maintain strict control of hypertension with blood pressure goal below 130/90, diabetes with hemoglobin A1c goal below 7.0 % and cholesterol with LDL cholesterol (bad cholesterol) goal below 70 mg/dL.   Signs of a Stroke? Follow the BEFAST method:  Balance Watch for a sudden loss of balance, trouble with coordination or vertigo Eyes Is there a sudden loss of vision in one or both eyes? Or double vision?  Face: Ask the person to smile. Does one side of the face droop or is it numb?  Arms: Ask the person to raise both arms. Does one arm drift downward? Is there weakness or numbness of a leg? Speech: Ask the person to repeat a simple phrase. Does the speech sound slurred/strange? Is the person confused ? Time: If you observe any of these signs, call 911.     Followup in the future with me in 6 months or call earlier if needed      Thank you for coming to see [place] at [facility]. I hope we have been able to provide you high quality care today.  You may receive a patient satisfaction survey over the next few weeks. We would appreciate your feedback and comments so that we may continue to improve ourselves and the health of our patients.

## 2023-05-13 ENCOUNTER — Ambulatory Visit (HOSPITAL_COMMUNITY): Payer: 59 | Attending: Adult Health | Admitting: Speech Pathology

## 2023-05-13 ENCOUNTER — Encounter (HOSPITAL_COMMUNITY): Payer: Self-pay | Admitting: Speech Pathology

## 2023-05-13 DIAGNOSIS — R41841 Cognitive communication deficit: Secondary | ICD-10-CM | POA: Diagnosis present

## 2023-05-13 DIAGNOSIS — I69319 Unspecified symptoms and signs involving cognitive functions following cerebral infarction: Secondary | ICD-10-CM | POA: Insufficient documentation

## 2023-05-13 DIAGNOSIS — I6981 Attention and concentration deficit following other cerebrovascular disease: Secondary | ICD-10-CM | POA: Diagnosis present

## 2023-05-13 NOTE — Therapy (Signed)
OUTPATIENT SPEECH LANGUAGE PATHOLOGY EVALUATION   Patient Name: Cameron [name] MRN: [id number] DOB:01-20-1966, 57 y.o., male Today's Date: 05/13/2023  PCP: Elfredia Nevins, MD REFERRING PROVIDER: Ihor Austin, NP  END OF SESSION:  End of Session - 05/13/23 0924     Visit Number 1    Number of Visits 9    Date for SLP Re-Evaluation 06/24/23    Authorization Type [employer] Primary, Medicaid secondary   eff 07/20/22 ded-0 oop 9400 met 3616.63 limit-30 copay-80.00 availity aetna   SLP Start Time 0845    SLP Stop Time  0930    SLP Time Calculation (min) 45 min    Activity Tolerance Patient tolerated treatment well             Past Medical History:  Diagnosis Date   Chronic shoulder pain    Hypertension    Stroke [facility])    History reviewed. No pertinent surgical history. Patient Active Problem List   Diagnosis Date Noted   Acute CVA (cerebrovascular accident) (HCC) 01/06/2023   CVA (cerebral vascular accident) (HCC) 01/05/2023   HTN (hypertension) 12/08/2021   Acute ischemic stroke (HCC) 12/08/2021   Uncontrolled type 2 diabetes mellitus with hyperglycemia, without long-term current use of insulin (HCC) 12/08/2021   Hyperlipidemia 12/08/2021   Tobacco abuse 12/08/2021   Alcohol use 12/08/2021   Cervical spine degeneration 12/08/2021   Hyponatremia 12/08/2021    ONSET DATE: 01/06/2023   REFERRING DIAG: I69.319 (ICD-10-CM) - Cognitive deficit, post-stroke  THERAPY DIAG:  Cognitive communication deficit  Attention and concentration deficit following other cerebrovascular disease  Rationale for Evaluation and Treatment: Rehabilitation  SUBJECTIVE:   SUBJECTIVE STATEMENT: "I can remember something right now and then can't remember later."  Pt accompanied by: self  PERTINENT HISTORY: Cameron [name] is a 57 year old African-American male who was referred by Ihor Austin, NP for SLP evaluation and treatment s/p CVA. Pt with past medical history significant for diabetes,  hypertension, hyperlipidemia, polysubstance abuse including cocaine, marijuana, cigarettes and alcohol. Initially presented on 12/08/2021 to [facility] with 5-day history of left-sided weakness, numbness and incoordination. MRI scan showed right internal capsule/corona radiata infarct. MRI of the brain showed no large vessel stenosis or occlusion. MRI cervical spine showed moderate stenosis and mild to moderate left-sided foraminal narrowing at C3-4 mild spinal stenosis at C4-5. Patient had some residual left-sided weakness but he returned on 01/05/2023 with new complaints of confusion, slurred speech and dizziness and gait ataxia. The symptoms also were ongoing for several days prior to ER visit. Urine drug screen was positive for cocaine and marijuana and alcohol level was also elevated. MRI confirmed left MCA patchy embolic infarct. 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 He reports difficulty with short memory and delayed recall, difficulty with multitasking and feels he moves and acts and moves much slower than before his stroke. His step brother assist with bill paying, he does not drive (was driving previously), manages own medications and does some cooking, maintains ADLs independently.  He does have some intermittent imbalance.  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 He has not worked with any therapies since hospital discharge.  PAIN:  Are you having pain? No  FALLS: Has patient fallen in last 6 months?  No  LIVING ENVIRONMENT: Lives with:  a friend near [place] Lives in: House/apartment  PLOF:  Level of assistance: Independent with ADLs, Independent with  IADLs Employment: Full-time employment  PATIENT GOALS: Improve memory  OBJECTIVE:  Note: Objective measures were completed at Evaluation unless otherwise noted.  DIAGNOSTIC FINDINGS: MRI HEAD  FINDINGS   Brain: Patchy acute white matter and cortically based infarcts in the left MCA territory affecting the inferior division. There is chronic small vessel ischemia to an extensive degree in the pons and also seen in the cerebral white matter. Remote basal ganglia infarction on the right. Wallerian degeneration is seen descending the right cerebral peduncle.  COGNITION: Overall cognitive status: Impaired Areas of impairment:  Attention: Impaired: Sustained Memory: Impaired: Working IT consultant function: Impaired: Problem solving, Organization, Planning, and Slow processing Functional deficits: Pt reports difficulty with memory, slower thinking, has smart phone to assist  AUDITORY COMPREHENSION: Overall auditory comprehension: Impaired: complex YES/NO questions: Appears intact Following directions: Appears intact Conversation: Complex Interfering components: attention, processing speed, and working memory Effective technique: extra processing time and repetition/stressing words  READING COMPREHENSION: Intact  EXPRESSION: verbal  VERBAL EXPRESSION: Level of generative/spontaneous verbalization: conversation Automatic speech: name: intact, social response: intact, and month of year: intact  Repetition: Appears intact Naming: Responsive: 76-100%, Confrontation: 76-100%, and Divergent: 76-100% Pragmatics: Appears intact Comments:  Interfering components:  N/A Effective technique:  N/A Non-verbal means of communication: N/A  WRITTEN EXPRESSION: Dominant hand: right Written expression: Appears intact  MOTOR SPEECH: Overall motor speech: Appears intact Level of impairment:  N/A Respiration:  WFL Phonation: normal Resonance: WFL Articulation: Appears intact Intelligibility: Intelligible Motor planning: Appears intact Motor speech errors:  N/A Interfering components:  N/A Effective technique:  N/A  ORAL MOTOR EXAMINATION: Overall status:  WFL  STANDARDIZED ASSESSMENTS: SLUMS: 22/30  [facility] SLUMS Examination Orientation  3/3  Numeric Problem Solving  3/3  Memory  4/5  Attention 1/2  Thought Organization 3/3  Clock Drawing 2/4  Visuospatial Skills               2/2  Short Story Recall  4/8  Total  22/30     Scoring  High School Education  Less than High School Education   Normal  27-30 25-30  Mild Neurocognitive Disorder 21-26 20-24  Dementia  1-20 1-19    TODAY'S TREATMENT:  Evaluation only this date.                                                                                                                                       DATE: 05/13/2023   PATIENT EDUCATION: Education details: Plan for SLP therapy to address memory and attention deficits Person educated: Patient Education method: Explanation Education comprehension: verbalized understanding   GOALS: Goals reviewed with patient? Yes  SHORT TERM GOALS: Target date: 06/24/2023  Pt will increase recall for requested information to 95% acc during functional memory exercises with use of compensatory strategies as needed when provided min cues.   Baseline: 50% Goal status: INITIAL  2.  Pt will utilize external aids/calendar/notebook  to manage his schedule and appointments with rare min A over 3 sessions Baseline: reports use of Smart phone at times for appointments Goal status: INITIAL  3.  Pt will complete moderate-level thought organization and planning activities with 90% acc and min assist Baseline: mod assist Goal status: INITIAL  4.  Pt will complete functional memory activities (mod level) with 90% acc when given min cues and use of memory strategies.  Baseline: 50% Goal status: INITIAL   LONG TERM GOALS: Same as short term goals  ASSESSMENT:  CLINICAL IMPRESSION: Patient is a 57 y.o. male who was seen today for a cognitive linguistic evaluation. He presents with mild attention, memory, and executive functioning deficits and reports  that his thinking is slower. Language is essentially intact. Pt would like to improve his speed of thinking and memory and appears motivated to address deficits. Recommend cognitive linguistic therapy to address the above goals.   OBJECTIVE IMPAIRMENTS: include attention, memory, and executive functioning. These impairments are limiting patient from return to work and managing appointments. Factors affecting potential to achieve goals and functional outcome are ability to learn/carryover information. Patient will benefit from skilled SLP services to address above impairments and improve overall function.  REHAB POTENTIAL: Excellent  PLAN:  SLP FREQUENCY: 2x/week  SLP DURATION: 4 weeks  PLANNED INTERVENTIONS: 92507 Treatment of speech (30 or 45 min) , Re-evaluation, Cueing hierachy, and Cognitive reorganization    Yiselle Babich, CCC-SLP 05/13/2023, 9:38 AM

## 2023-05-14 DIAGNOSIS — E119 Type 2 diabetes mellitus without complications: Secondary | ICD-10-CM | POA: Diagnosis not present

## 2023-05-14 DIAGNOSIS — I10 Essential (primary) hypertension: Secondary | ICD-10-CM | POA: Diagnosis not present

## 2023-05-14 DIAGNOSIS — L600 Ingrowing nail: Secondary | ICD-10-CM | POA: Diagnosis not present

## 2023-05-14 DIAGNOSIS — R220 Localized swelling, mass and lump, head: Secondary | ICD-10-CM | POA: Diagnosis not present

## 2023-05-14 DIAGNOSIS — L731 Pseudofolliculitis barbae: Secondary | ICD-10-CM | POA: Diagnosis not present

## 2023-05-21 DIAGNOSIS — Z419 Encounter for procedure for purposes other than remedying health state, unspecified: Secondary | ICD-10-CM | POA: Diagnosis not present

## 2023-05-25 ENCOUNTER — Encounter (HOSPITAL_COMMUNITY): Payer: 59 | Admitting: Speech Pathology

## 2023-05-25 ENCOUNTER — Telehealth (HOSPITAL_COMMUNITY): Payer: Self-pay | Admitting: Speech Pathology

## 2023-05-25 NOTE — Telephone Encounter (Signed)
Telephone Call:   Pt did not show for his appointment. He apologized and stated that he forgot about his appointment, but would be at his next appointment.   Thank you,  Havery Moros, CCC-SLP [phone]

## 2023-05-27 ENCOUNTER — Encounter (HOSPITAL_COMMUNITY): Payer: 59 | Admitting: Speech Pathology

## 2023-05-27 ENCOUNTER — Telehealth (HOSPITAL_COMMUNITY): Payer: Self-pay | Admitting: Speech Pathology

## 2023-05-27 NOTE — Telephone Encounter (Signed)
Telephone Call:  Pt called to cancel his 1100 appointment today due to family issues. He says he will be at his appointment next week.  Thank you,  Havery Moros, CCC-SLP [phone]

## 2023-06-02 ENCOUNTER — Encounter (HOSPITAL_COMMUNITY): Payer: 59 | Admitting: Speech Pathology

## 2023-06-02 ENCOUNTER — Telehealth (HOSPITAL_COMMUNITY): Payer: Self-pay | Admitting: Speech Pathology

## 2023-06-02 NOTE — Telephone Encounter (Signed)
Telephone Call:  Pt did not show for his 1100 SLP appointment again today. Message left reminding him of his appointment tomorrow and his missed appointment today.  Thank you,  Havery Moros, CCC-SLP [phone]

## 2023-06-03 ENCOUNTER — Telehealth (HOSPITAL_COMMUNITY): Payer: Self-pay | Admitting: Speech Pathology

## 2023-06-03 ENCOUNTER — Encounter (HOSPITAL_COMMUNITY): Payer: 59 | Admitting: Speech Pathology

## 2023-06-03 NOTE — Telephone Encounter (Signed)
Telephone Call:  Pt has missed 4 of his scheduled appointments (3 of them were "no show"). Message left that all appointments would be canceled and he can call back if he would like to reschedule.  Thank you,  Havery Moros, CCC-SLP [phone]

## 2023-06-09 ENCOUNTER — Encounter (HOSPITAL_COMMUNITY): Payer: 59 | Admitting: Speech Pathology

## 2023-06-10 ENCOUNTER — Encounter (HOSPITAL_COMMUNITY): Payer: 59 | Admitting: Speech Pathology

## 2023-06-10 ENCOUNTER — Encounter (HOSPITAL_COMMUNITY): Payer: Self-pay | Admitting: Speech Pathology

## 2023-06-10 NOTE — Therapy (Signed)
Documentation only  SPEECH THERAPY DISCHARGE SUMMARY  Visits from Start of Care: 1  Current functional level related to goals / functional outcomes: Not met   Remaining deficits: See evaluation   Education / Equipment: N/A   Patient agrees to discharge. Patient goals were not met. Patient is being discharged due to not returning since the last visit.Cameron [name]  Pt did not return for scheduled visits after his initial evaluation.   Thank you,  Havery Moros, CCC-SLP [phone]

## 2023-06-15 ENCOUNTER — Encounter (HOSPITAL_COMMUNITY): Payer: 59 | Admitting: Speech Pathology

## 2023-06-20 DIAGNOSIS — Z419 Encounter for procedure for purposes other than remedying health state, unspecified: Secondary | ICD-10-CM | POA: Diagnosis not present

## 2023-06-21 ENCOUNTER — Encounter (HOSPITAL_COMMUNITY): Payer: 59 | Admitting: Speech Pathology

## 2023-06-23 ENCOUNTER — Encounter (HOSPITAL_COMMUNITY): Payer: 59 | Admitting: Speech Pathology

## 2023-06-24 DIAGNOSIS — L600 Ingrowing nail: Secondary | ICD-10-CM | POA: Diagnosis not present

## 2023-06-24 DIAGNOSIS — R220 Localized swelling, mass and lump, head: Secondary | ICD-10-CM | POA: Diagnosis not present

## 2023-06-24 DIAGNOSIS — E7849 Other hyperlipidemia: Secondary | ICD-10-CM | POA: Diagnosis not present

## 2023-06-24 DIAGNOSIS — E119 Type 2 diabetes mellitus without complications: Secondary | ICD-10-CM | POA: Diagnosis not present

## 2023-06-24 DIAGNOSIS — I10 Essential (primary) hypertension: Secondary | ICD-10-CM | POA: Diagnosis not present

## 2023-07-01 ENCOUNTER — Ambulatory Visit (INDEPENDENT_AMBULATORY_CARE_PROVIDER_SITE_OTHER): Payer: 59 | Admitting: Podiatry

## 2023-07-01 ENCOUNTER — Encounter: Payer: Self-pay | Admitting: Podiatry

## 2023-07-01 DIAGNOSIS — E1165 Type 2 diabetes mellitus with hyperglycemia: Secondary | ICD-10-CM | POA: Diagnosis not present

## 2023-07-01 DIAGNOSIS — M79675 Pain in left toe(s): Secondary | ICD-10-CM | POA: Diagnosis not present

## 2023-07-01 DIAGNOSIS — M79674 Pain in right toe(s): Secondary | ICD-10-CM | POA: Diagnosis not present

## 2023-07-01 DIAGNOSIS — B351 Tinea unguium: Secondary | ICD-10-CM

## 2023-07-01 DIAGNOSIS — B353 Tinea pedis: Secondary | ICD-10-CM | POA: Diagnosis not present

## 2023-07-01 MED ORDER — TERBINAFINE HCL 250 MG PO TABS: 250.0000 mg | ORAL_TABLET | Freq: Every day | ORAL | 0 refills | Status: AC

## 2023-07-01 NOTE — Progress Notes (Signed)
  Subjective:  Patient ID: Cameron [name], male    DOB: 22-Feb-1966,  MRN: [id number]  Chief Complaint  Patient presents with   Foot Pain    DM II. Both feet hurting for sometime on /off. Not numb, can tell difference between cold/hot sensations. Trim nails both feet.     57 y.o. male presents with the above complaint. History confirmed with patient. Patient presenting with pain related to dystrophic thickened elongated nails. Patient is unable to trim own nails related to nail dystrophy and/or mobility issues. Patient does have a history of T2DM.  He is unsure of his last A1c.   Objective:  Physical Exam: warm, good capillary refill nail exam onychomycosis of the toenails, onycholysis, dystrophic nails, and yellow discoloration with subungual debris. DP pulses palpable, PT pulses palpable, and protective sensation intact Left Foot:  Pain with palpation of nails due to elongation and dystrophic growth.  Porokeratosis noted to the left midfoot with nucleated core Right Foot: Pain with palpation of nails due to elongation and dystrophic growth.  Bilateral plantar skin is xerotic and a moccasin distribution, excessive dead macerated skin present to the toes and interdigital spaces Assessment:   1. Pain due to onychomycosis of toenails of both feet   2. Uncontrolled type 2 diabetes mellitus with hyperglycemia, without long-term current use of insulin (HCC)   3. Tinea pedis of both feet      Plan:  Patient was evaluated and treated and all questions answered.  # Tinea pedis -Starting patient on 21-day course of oral terbinafine 250 mg daily - Patient instructed to wash feet and interdigital spaces with soap and water daily -Instructed patient to contact office if symptoms do not improve or worsen.  #Onychomycosis with pain  -Nails palliatively debrided as below. -Educated on self-care  # Uncontrolled type 2 diabetes -Porokeratosis sharply debrided to patient tolerance as a courtesy.   Discussed home management strategies including white vinegar scrubs, offloading, appropriate shoe gear, following down with emery board or pumice stone. -Patient educated on diabetes. Discussed proper diabetic foot care and discussed risks and complications of disease. Educated patient in depth on reasons to return to the office immediately should he discover anything concerning or new on the feet. All questions answered. Discussed proper shoes as well.    Procedure: Nail Debridement Rationale: Pain Type of Debridement: manual, sharp debridement. Instrumentation: Nail nipper, rotary burr. Number of Nails: 10  Return in about 3 months (around 09/29/2023) for Diabetic Foot Care.         Bronwen Betters, DPM [facility]

## 2023-07-07 DIAGNOSIS — Z0271 Encounter for disability determination: Secondary | ICD-10-CM

## 2023-07-21 DIAGNOSIS — Z419 Encounter for procedure for purposes other than remedying health state, unspecified: Secondary | ICD-10-CM | POA: Diagnosis not present

## 2023-08-21 DIAGNOSIS — Z419 Encounter for procedure for purposes other than remedying health state, unspecified: Secondary | ICD-10-CM | POA: Diagnosis not present

## 2023-09-09 ENCOUNTER — Telehealth: Payer: Self-pay | Admitting: Adult Health

## 2023-09-09 NOTE — Telephone Encounter (Signed)
Called the pt back and offered a sooner available apt for Monday with Shanda Bumps NP

## 2023-09-09 NOTE — Telephone Encounter (Signed)
Pt asking if can be seen sooner due to need to know if can return to work. Child Support asking what's the reason can not return to work.

## 2023-09-10 NOTE — Progress Notes (Deleted)
 [facility] [address] [place]. [place] [address] [phone]       OFFICE FOLLOW UP NOTE  Cameron [name] Date of Birth:  12-14-1965 Medical Record Number:  [id number]    Primary neurologist: Dr. Pearlean Brownie Reason for visit: Stroke follow-up    No chief complaint on file.      HPI:   Update 09/13/2023 JM: Patient returns for sooner scheduled visit to discuss returning to work.   Reports persistent memory difficulties, has not been able to return back to work primarily due to cognitive impairment as well as intermittent imbalance.  He was referred to SLP at prior visit for which she was evaluated on 10/24 who noted mild attention, memory and executive functioning deficits but unfortunately did not show for 3/4 consecutive visits with 1 cancelled therefore remaining visits canceled.       History provided for reference purposes only Update 04/28/2023 JM: Patient returns for stroke follow-up.  Reports doing well since prior visit.  Reports some improvement of memory since last visit but still not at baseline, has difficulty with short memory and delayed recall, difficulty with multitasking and feels he moves and acts and moves much slower than before his stroke. His step brother assist with bill paying, he does not drive (was driving previously), manages own medications and does some cooking, maintains ADLs independently.  He does have some intermittent imbalance.  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 He has not worked with any therapies since hospital discharge.  Denies new stroke/TIA symptoms.  Completed 3 months DAPT, remains on aspirin alone as well as atorvastatin Routinely follows with PCP for stroke risk factor management Reports continued tobacco use, about 3-5 cigarettes per day but gradually working on complete cessation Denies any street/recreational drug or ETOH  use  Consult visit 02/10/2023 Dr. Pearlean Brownie: Cameron [name] is a 58 year old African-American male with past medical history significant for diabetes, hypertension, hyperlipidemia, polysubstance abuse including cocaine, marijuana, cigarettes and alcohol.  Initially presented on 12/08/2021 to [facility] with 5-day history of left-sided weakness, numbness and incoordination.  MRI scan showed right internal capsule/corona radiata infarct.  MRI of the brain showed no large vessel stenosis or occlusion.  MRI cervical spine showed moderate stenosis and mild to moderate left-sided foraminal narrowing at C3-4 mild spinal stenosis at C4-5.  Patient had some residual left-sided weakness but he returned on 01/05/2023 with new complaints of confusion, slurred speech and dizziness and gait ataxia.  The symptoms also were ongoing for several days prior to ER visit.  Urine drug screen was positive for cocaine and marijuana and alcohol level was also elevated.  MRI confirmed left MCA patchy embolic infarct.  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  Carotid ultrasound showed less than 50% proximal left ICA stenosis.  LDL cholesterol was 74 mg percent.  Hemoglobin A1c was 5.9.  Echocardiogram showed ejection fraction of 60 to 65%.  Patient was advised to take dual antiplatelet therapy aspirin and Plavix for 3 months followed by aspirin alone and aggressive risk factor modification.  He was counseled to quit cigarettes, alcohol, cocaine and marijuana.  He is seen for follow-up today in the office accompanied by his sister.  He states he has been compliant with his medications but states he has not been taking Plavix.  His blood pressure is well-controlled and today it is slightly elevated 147/93.  His tolerating aspirin well without bruising or bleeding and Lipitor without muscle aches and pains.  He states his sugars are doing well.  He continues to have poor balance and occasionally  gets dizzy and off balance.  His main complaint is that his short-term memory and cognitive difficulties.  .  He states his brain cannot think quickly and he has had trouble making new memories and handling his affairs.  Forgets appointments and recent information.Marland Kitchen  He is states his brain is slow in thinking.  He has a upcoming court date and feels he will not be able to follow the court proceedings well due to his memory and cognitive difficulties.  He denies any new recurrent stroke or TIA symptoms.  He admits to still doing polysubstance abuse and realizes that he needs to change his lifestyle and quit doing so     ROS:   14 system review of systems is positive for those listed in HPI and all other systems negative  PMH:  Past Medical History:  Diagnosis Date   Chronic shoulder pain    Hypertension    Stroke [facility])     Social History:  Social History   Socioeconomic History   Marital status: Legally Separated    Spouse name: Not on file   Number of children: Not on file   Years of education: Not on file   Highest education level: Not on file  Occupational History   Not on file  Tobacco Use   Smoking status: Every Day    Current packs/day: 0.50    Types: Cigarettes   Smokeless tobacco: Never  Substance and Sexual Activity   Alcohol use: Yes   Drug use: Yes    Types: Cocaine, Marijuana    Comment: last use 01/05/2023   Sexual activity: Not on file  Other Topics Concern   Not on file  Social History Narrative   Not on file   Social Drivers of Health   Financial Resource Strain: Not on file  Food Insecurity: No Food Insecurity (01/05/2023)   Hunger Vital Sign    Worried About Running Out of Food in the Last Year: Never true    Ran Out of Food in the Last Year: Never true  Transportation Needs: No Transportation Needs (01/05/2023)   PRAPARE - Administrator, Civil Service (Medical): No    Lack of Transportation (Non-Medical): No  Physical Activity: Not on  file  Stress: Not on file  Social Connections: Not on file  Intimate Partner Violence: Not At Risk (01/05/2023)   Humiliation, Afraid, Rape, and Kick questionnaire    Fear of Current or Ex-Partner: No    Emotionally Abused: No    Physically Abused: No    Sexually Abused: No    Medications:   Current Outpatient Medications on File Prior to Visit  Medication Sig Dispense Refill   aspirin 81 MG chewable tablet Chew 1 tablet (81 mg total) by mouth daily.     atorvastatin (LIPITOR) 80 MG tablet Take 1 tablet (80 mg total) by mouth daily. 30 tablet 11   blood glucose meter kit and supplies KIT Dispense based on patient and insurance preference. Use up to four times daily as directed. 1 each 1   glipiZIDE (GLUCOTROL) 5 MG tablet Take 1 tablet (5 mg total) by mouth daily before breakfast. 30 tablet 1   lisinopril (ZESTRIL) 20 MG tablet Take 1 tablet (20 mg total) by mouth daily. 30 tablet 1   metFORMIN (GLUCOPHAGE)  500 MG tablet Take 1 tablet (500 mg total) by mouth 2 (two) times daily with a meal. 60 tablet 1   No current facility-administered medications on file prior to visit.    Allergies:  No Known Allergies  Physical Exam There were no vitals filed for this visit.  There is no height or weight on file to calculate BMI.  General: well developed, well nourished middle-aged African-American male, seated, in no evident distress Head: head normocephalic and atraumatic.   Neck: supple with no carotid or supraclavicular bruits Cardiovascular: regular rate and rhythm, no murmurs Musculoskeletal: no deformity Skin:  no rash/petichiae Vascular:  Normal pulses all extremities  Neurologic Exam Mental Status: Awake and fully alert. No evidence of aphasia or dysarthria. Follows commands without difficulty.  Oriented to place and time. Recent memory impaired and remote memory intact. Attention span, concentration and fund of knowledge overall appropriate during visit. Mood and affect  appropriate.   Cranial Nerves: Pupils equal, briskly reactive to light. Extraocular movements full without nystagmus. Visual fields full to confrontation. Hearing intact. Facial sensation intact.  Subtle left facial asymmetry, tongue, palate moves normally and symmetrically.  Motor: Normal strength in right upper and lower extremities, very slight left sided weakness (chronic from prior stroke) Sensory.: intact to touch , pinprick , position and vibratory sensation.  Coordination: Rapid alternating movements normal in all extremities except slightly decreased left hand. Finger-to-nose and heel-to-shin performed accurately bilaterally. Gait and Station: Arises from chair without difficulty. Stance is normal. Gait demonstrates normal stride length and mild imbalance.  Reflexes: 1+ and symmetric. Toes downgoing.       04/28/2023    2:02 PM 02/10/2023    4:38 PM  MMSE - Mini Mental State Exam  Orientation to time 5 5  Orientation to Place 4 4  Registration 3 3  Attention/ Calculation 5 1  Recall 2 1  Language- name 2 objects 2 2  Language- repeat 1 1  Language- follow 3 step command 3 3  Language- read & follow direction 1 1  Write a sentence 1 1  Copy design 0 0  Total score 27 22       ASSESSMENT: 58 year old African-American male with right subcortical infarct in May 2023 with residual mild left hemiparesis and left MCA branch infarct in June 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  He has mild residual gait and balance difficulties as well as mild cognitive impairment poststroke     PLAN:  -referral placed to SLP at [facility] for cognitive therapy -can consider neurocognitive eval if cognitive difficulties persist to evaluate full extent of cognitive impairment  -Continue aspirin and atorvastatin for secondary stroke prevention measures  managed/prescribed by PCP -Continue close follow-up with PCP for aggressive stroke risk factor management including BP goal<130/90, HLD with LDL goal<70 and DM with A1c.<7  -Discussed importance of complete tobacco cessation - advised to follow up with PCP if further assistance is needed -Continue to avoid street/recreational drug and EtOH use    Follow-up in 6 months or call earlier if needed    I spent 40 minutes of face-to-face and non-face-to-face time with patient.  This included previsit chart review, lab review, study review, order entry, electronic health record documentation, patient education and discussion regarding above diagnoses and treatment plan and answered all other questions to patient's satisfaction  Ihor Austin, AGNP-BC  [facility] [address] [place], [place] [address]  Phone [phone] Fax [phone] Note: This document was prepared with digital dictation and possible smart phrase technology. Any transcriptional errors that result from this process are unintentional.

## 2023-09-13 ENCOUNTER — Ambulatory Visit: Payer: 59 | Admitting: Adult Health

## 2023-09-13 ENCOUNTER — Telehealth: Payer: Self-pay | Admitting: Adult Health

## 2023-09-13 NOTE — Telephone Encounter (Signed)
 Pt called at 8:43 am on the  way to appointment but will be late. Informed patient would need to reschedule. Patient said I have another appointment on 11/17/23 will just come to that appointment.

## 2023-09-18 DIAGNOSIS — Z419 Encounter for procedure for purposes other than remedying health state, unspecified: Secondary | ICD-10-CM | POA: Diagnosis not present

## 2023-09-30 ENCOUNTER — Ambulatory Visit: Payer: 59 | Admitting: Podiatry

## 2023-10-29 ENCOUNTER — Ambulatory Visit (INDEPENDENT_AMBULATORY_CARE_PROVIDER_SITE_OTHER): Admitting: Podiatry

## 2023-10-29 ENCOUNTER — Encounter: Payer: Self-pay | Admitting: Podiatry

## 2023-10-29 DIAGNOSIS — B351 Tinea unguium: Secondary | ICD-10-CM

## 2023-10-29 DIAGNOSIS — M79675 Pain in left toe(s): Secondary | ICD-10-CM

## 2023-10-29 DIAGNOSIS — M79674 Pain in right toe(s): Secondary | ICD-10-CM | POA: Diagnosis not present

## 2023-10-29 DIAGNOSIS — B353 Tinea pedis: Secondary | ICD-10-CM

## 2023-10-29 DIAGNOSIS — E1151 Type 2 diabetes mellitus with diabetic peripheral angiopathy without gangrene: Secondary | ICD-10-CM

## 2023-10-29 DIAGNOSIS — L84 Corns and callosities: Secondary | ICD-10-CM

## 2023-10-29 MED ORDER — KETOCONAZOLE 2 % EX CREA
1.0000 | TOPICAL_CREAM | Freq: Every day | CUTANEOUS | 0 refills | Status: AC
Start: 2023-10-29 — End: ?

## 2023-10-29 NOTE — Progress Notes (Signed)
  Subjective:  Patient ID: Cameron [name], male    DOB: 10-25-1965,  MRN: [id number]  Chief Complaint  Patient presents with   [facility]    58 y.o. male presents with the above complaint. History confirmed with patient. Patient presenting with pain related to dystrophic thickened elongated nails. Patient is unable to trim own nails related to nail dystrophy and/or mobility issues. Patient does have a history of T2DM.  He is unsure of his last A1c.  Presents with painful callus left plantar medial midfoot.  Objective:  Physical Exam: warm, good capillary refill nail exam onychomycosis of the toenails, onycholysis, dystrophic nails, and yellow discoloration with subungual debris. DP faintly pulses palpable, PT faintly pulses palpable, and protective sensation intact Left Foot:  Pain with palpation of nails due to elongation and dystrophic growth.  Porokeratosis noted to the left midfoot with nucleated core Right Foot: Pain with palpation of nails due to elongation and dystrophic growth.  excessive dead and  macerated skin present to the toes and interdigital spaces Assessment:   1. Pain due to onychomycosis of toenails of both feet   2. Pre-ulcerative calluses   3. Type 2 diabetes mellitus with diabetic peripheral angiopathy without gangrene, unspecified whether long term insulin use (HCC)   4. Tinea pedis of both feet      Plan:  Patient was evaluated and treated and all questions answered.  # Tinea pedis - Plantar skin appears improved from previous overall. -Sending course of topical ketoconazole cream 2% to be applied to the interdigital spaces, allowing areas to air dry. -Discussed pedal hygiene and treatment of athlete's foot.  #Onychomycosis with pain  -Nails palliatively debrided as below. -Educated on self-care  # Painful porokeratosis left plantar medial midfoot -Porokeratosis x 1 sharply debrided to patient tolerance as a courtesy.  Discussed home management strategies  including white vinegar scrubs, offloading, appropriate shoe gear, following down with emery board or pumice stone.  # Uncontrolled type 2 diabetes -Patient educated on diabetes. Discussed proper diabetic foot care and discussed risks and complications of disease. Educated patient in depth on reasons to return to the office immediately should he discover anything concerning or new on the feet. All questions answered. Discussed proper shoes as well.    Procedure: Nail Debridement Rationale: Pain Type of Debridement: manual, sharp debridement. Instrumentation: Nail nipper, rotary burr. Number of Nails: 10  Return in 3 to 4 weeks if dry skin between the toes does not improve  Return in about 3 months (around 01/28/2024) for Diabetic Foot Care.         Eve Hinders, DPM [facility] / [facility]

## 2023-10-30 DIAGNOSIS — Z419 Encounter for procedure for purposes other than remedying health state, unspecified: Secondary | ICD-10-CM | POA: Diagnosis not present

## 2023-11-17 ENCOUNTER — Encounter: Payer: Self-pay | Admitting: Adult Health

## 2023-11-17 ENCOUNTER — Ambulatory Visit: Payer: 59 | Admitting: Adult Health

## 2023-11-17 VITALS — BP 129/83 | HR 82 | Ht 70.0 in | Wt 170.0 lb

## 2023-11-17 DIAGNOSIS — G3184 Mild cognitive impairment, so stated: Secondary | ICD-10-CM

## 2023-11-17 DIAGNOSIS — I69319 Unspecified symptoms and signs involving cognitive functions following cerebral infarction: Secondary | ICD-10-CM | POA: Diagnosis not present

## 2023-11-17 DIAGNOSIS — I63512 Cerebral infarction due to unspecified occlusion or stenosis of left middle cerebral artery: Secondary | ICD-10-CM

## 2023-11-17 DIAGNOSIS — G8929 Other chronic pain: Secondary | ICD-10-CM | POA: Diagnosis not present

## 2023-11-17 NOTE — Progress Notes (Signed)
 [facility] [address] [place]. [place] [address] [phone]       OFFICE FOLLOW UP NOTE  [name] Date of Birth:  1966/04/27 Medical Record Number:  [id number]    Primary neurologist: Dr. Janett Medin Reason for visit: Stroke follow-up    Chief Complaint  Patient presents with   Cerebrovascular Accident    Rm 8 with sister Cameron [name]  Pt is well and stable,reports no new stroke concerns.        HPI:   Update 11/17/2023 JM: Patient returns for follow-up visit accompanied by his sister.  He continues to have cognitive difficulties which he believes overall stable since prior visit but does feel possibly some worsening. He feels he continues to function at a slower pace, feels reaction time is impaired.  He was referred to SLP at prior visit and was evaluated on 10/24 who noted mild cognitive deficits but patient did not show for follow-up visits. He reports due to frequent court cases. He does agree to restarting.  He also reports left shoulder pain since his stroke. Does have hx of prior shoulder injury but was not experiencing any pain prior to his stroke.  He has not been seen by orthopedics for this pain.  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  Remains on atorvastatin  without side effects.  He self discontinued aspirin  as he did not feel this was needed.  He is willing to restart.  Blood pressure well-controlled.  Has been working on dietary measures such as limiting sodium intake which he believes has contributed to improved blood pressures.  He has not seen PCP recently, plans on calling to schedule follow-up visit.  Continued tobacco use, about 1 pack every 2-3 days.  Reports rare use of EtOH, denies street drug use.     History provided for reference purposes only Update 04/28/2023 JM: Patient  returns for stroke follow-up.  Reports doing well since prior visit.  Reports some improvement of memory since last visit but still not at baseline, has difficulty with short memory and delayed recall, difficulty with multitasking and feels he moves and acts and moves much slower than before his stroke. His step brother assist with bill paying, he does not drive (was driving previously), manages own medications and does some cooking, maintains ADLs independently.  He does have some intermittent imbalance.  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 He has not worked with any therapies since hospital discharge.  Denies new stroke/TIA symptoms.  Completed 3 months DAPT, remains on aspirin  alone as well as atorvastatin  Routinely follows with PCP for stroke risk factor management Reports continued tobacco use, about 3-5 cigarettes per day but gradually working on complete cessation Denies any street/recreational drug or ETOH use  Consult visit 02/10/2023 Dr. Janett Medin: Mr. [name] is a 58 year old African-American male with past medical history significant for diabetes, hypertension, hyperlipidemia, polysubstance abuse including cocaine, marijuana, cigarettes and alcohol.  Initially presented on 12/08/2021 to [facility] with 5-day history of left-sided weakness, numbness and incoordination.  MRI scan showed right internal capsule/corona radiata infarct.  MRI of the brain showed no large vessel stenosis or occlusion.  MRI cervical spine showed moderate stenosis and mild to moderate left-sided foraminal narrowing at C3-4 mild spinal stenosis at C4-5.  Patient had some residual left-sided weakness but he returned on 01/05/2023 with new complaints of confusion, slurred speech and dizziness and gait ataxia.  The symptoms also were ongoing for several days prior to ER visit.  Urine drug screen was positive for cocaine and marijuana and  alcohol level was also elevated.  MRI confirmed left MCA patchy embolic infarct.  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  Carotid ultrasound showed less than 50% proximal left ICA stenosis.  LDL cholesterol was 74 mg percent.  Hemoglobin A1c was 5.9.  Echocardiogram showed ejection fraction of 60 to 65%.  Patient was advised to take dual antiplatelet therapy aspirin  and Plavix  for 3 months followed by aspirin  alone and aggressive risk factor modification.  He was counseled to quit cigarettes, alcohol, cocaine and marijuana.  He is seen for follow-up today in the office accompanied by his sister.  He states he has been compliant with his medications but states he has not been taking Plavix .  His blood pressure is well-controlled and today it is slightly elevated 147/93.  His tolerating aspirin  well without bruising or bleeding and Lipitor without muscle aches and pains.  He states his sugars are doing well.  He continues to have poor balance and occasionally gets dizzy and off balance.  His main complaint is that his short-term memory and cognitive difficulties.  .  He states his brain cannot think quickly and he has had trouble making new memories and handling his affairs.  Forgets appointments and recent information.Cameron [name]  He is states his brain is slow in thinking.  He has a upcoming court date and feels he will not be able to follow the court proceedings well due to his memory and cognitive difficulties.  He denies any new recurrent stroke or TIA symptoms.  He admits to still doing polysubstance abuse and realizes that he needs to change his lifestyle and quit doing so     ROS:   14 system review of systems is positive for those listed in HPI and all other systems negative  PMH:  Past Medical History:  Diagnosis Date   Chronic shoulder pain    Hypertension    Stroke [facility])     Social History:  Social History   Socioeconomic History   Marital status:  Legally Separated    Spouse name: Not on file   Number of children: Not on file   Years of education: Not on file   Highest education level: Not on file  Occupational History   Not on file  Tobacco Use   Smoking status: Every Day    Current packs/day: 0.50    Types: Cigarettes   Smokeless tobacco: Never  Substance and Sexual Activity   Alcohol use: Yes   Drug use: Yes    Types: Cocaine, Marijuana    Comment: last use 01/05/2023   Sexual activity: Not on file  Other Topics Concern   Not on file  Social History Narrative   Not on file   Social Drivers of Health   Financial Resource Strain: Not on file  Food Insecurity: No Food Insecurity (01/05/2023)   Hunger Vital Sign    Worried About Running Out of Food in the Last Year: Never true    Ran Out of Food in the Last Year: Never true  Transportation Needs: No Transportation Needs (01/05/2023)   PRAPARE - Administrator, Civil Service (Medical): No    Lack of Transportation (Non-Medical):  No  Physical Activity: Not on file  Stress: Not on file  Social Connections: Not on file  Intimate Partner Violence: Not At Risk (01/05/2023)   Humiliation, Afraid, Rape, and Kick questionnaire    Fear of Current or Ex-Partner: No    Emotionally Abused: No    Physically Abused: No    Sexually Abused: No    Medications:   Current Outpatient Medications on File Prior to Visit  Medication Sig Dispense Refill   amLODipine (NORVASC) 5 MG tablet Take 5 mg by mouth daily.     atorvastatin  (LIPITOR) 80 MG tablet Take 1 tablet (80 mg total) by mouth daily. 30 tablet 11   blood glucose meter kit and supplies KIT Dispense based on patient and insurance preference. Use up to four times daily as directed. 1 each 1   clopidogrel  (PLAVIX ) 75 MG tablet Take 75 mg by mouth daily.     glipiZIDE  (GLUCOTROL ) 5 MG tablet Take 1 tablet (5 mg total) by mouth daily before breakfast. 30 tablet 1   ketoconazole  (NIZORAL ) 2 % cream Apply 1 Application  topically daily. 15 g 0   lisinopril  (ZESTRIL ) 20 MG tablet Take 1 tablet (20 mg total) by mouth daily. 30 tablet 1   metFORMIN  (GLUCOPHAGE ) 500 MG tablet Take 1 tablet (500 mg total) by mouth 2 (two) times daily with a meal. 60 tablet 1   No current facility-administered medications on file prior to visit.    Allergies:  No Known Allergies  Physical Exam Today's Vitals   11/17/23 1023  BP: 129/83  Pulse: 82  Weight: 170 lb (77.1 kg)  Height: 5\' 10"  (1.778 m)    Body mass index is 24.39 kg/m.  General: well developed, well nourished, very pleasant middle-aged African-American male, seated, in no evident distress Head: head normocephalic and atraumatic.   Neck: supple with no carotid or supraclavicular bruits Cardiovascular: regular rate and rhythm, no murmurs Musculoskeletal: no deformity Skin:  no rash/petichiae Vascular:  Normal pulses all extremities  Neurologic Exam Mental Status: Awake and fully alert. No evidence of aphasia or dysarthria. Follows commands without difficulty.  Oriented to place and time. Recent memory impaired and remote memory intact. Attention span, concentration and fund of knowledge overall appropriate during visit. Mood and affect appropriate.   Cranial Nerves: Pupils equal, briskly reactive to light. Extraocular movements full without nystagmus. Visual fields full to confrontation. Hearing intact. Facial sensation intact.  Subtle left facial asymmetry, tongue, palate moves normally and symmetrically.  Motor: Normal strength in right upper and lower extremities, very slight left sided weakness (chronic from prior stroke) Sensory.: intact to touch , pinprick , position and vibratory sensation.  Coordination: Rapid alternating movements normal in all extremities except slightly decreased left hand. Finger-to-nose and heel-to-shin performed accurately bilaterally. Gait and Station: Arises from chair without difficulty. Stance is normal. Gait demonstrates  normal stride length and mild imbalance.  Reflexes: 1+ and symmetric. Toes downgoing.       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      PLAN:  1.  History of strokes -Residual deficit: Cognitive impairment and mild left-sided weakness.  Referral placed again to [facility] SLP.  Discussed potential benefit with routine visits with SLP for possible improvement of cognition -Complains of left shoulder pain, unclear if exacerbation of prior shoulder pain after prior injury vs post stroke.  Encouraged discussion with PCP for evaluation with orthopedics -Continues to pursue Social Security disability due to residual stroke deficits. Feels due to cognitive difficulties with slower thinking, moving and impaired reaction time, he would not be able to perform his job adequately or safely. He is currently being set up for neurocognitive testing through Social Security disability -restart aspirin  and continue atorvastatin  for secondary stroke prevention measures managed/prescribed by PCP.  Discussed importance of compliance with medications for further stroke prevention -Continue close follow-up with PCP for aggressive stroke risk factor management including BP goal<130/90, and HLD  with LDL goal<70  -Discussed importance of complete tobacco cessation - advised to follow up with PCP if further assistance is needed -Continue to avoid street/recreational drug and limit EtOH use    Follow-up in 9 months or call earlier if needed    I spent 30 minutes of face-to-face and non-face-to-face time with patient and sister.  This included previsit chart review, lab review, study review, order entry, electronic health record documentation, patient education and discussion regarding above diagnoses and treatment plan and answered all other questions to patient's satisfaction  Johny Nap, [facility]  [facility] [address] [place], [place] [address]  Phone [phone] Fax [phone] Note: This document was prepared with digital dictation and possible smart phrase technology. Any transcriptional errors that result from this process are unintentional.

## 2023-11-17 NOTE — Patient Instructions (Addendum)
 Referral placed to speech therapy for cognitive therapy - you will be called to schedule   Continue aspirin  81 mg daily  and atorvastatin  80mg  daily  for secondary stroke prevention  Continue working on complete tobacco cessation as continued use greatly increases risk of additional strokes   Continue to follow up with PCP regarding blood pressure, cholesterol and diabetes management  Maintain strict control of hypertension with blood pressure goal below 130/90, diabetes with hemoglobin A1c goal below 7.0 % and cholesterol with LDL cholesterol (bad cholesterol) goal below 70 mg/dL.   Signs of a Stroke? Follow the BEFAST method:  Balance Watch for a sudden loss of balance, trouble with coordination or vertigo Eyes Is there a sudden loss of vision in one or both eyes? Or double vision?  Face: Ask the person to smile. Does one side of the face droop or is it numb?  Arms: Ask the person to raise both arms. Does one arm drift downward? Is there weakness or numbness of a leg? Speech: Ask the person to repeat a simple phrase. Does the speech sound slurred/strange? Is the person confused ? Time: If you observe any of these signs, call 911.      Followup in the future with me in 9 months or call earlier if needed      Thank you for coming to see us  at [facility]. I hope we have been able to provide you high quality care today.  You may receive a patient satisfaction survey over the next few weeks. We would appreciate your feedback and comments so that we may continue to improve ourselves and the health of our patients.

## 2023-11-18 ENCOUNTER — Encounter: Payer: Self-pay | Admitting: Adult Health

## 2023-11-23 ENCOUNTER — Ambulatory Visit (HOSPITAL_COMMUNITY): Admitting: Speech Pathology

## 2023-11-25 DIAGNOSIS — M25512 Pain in left shoulder: Secondary | ICD-10-CM | POA: Diagnosis not present

## 2023-11-25 DIAGNOSIS — I10 Essential (primary) hypertension: Secondary | ICD-10-CM | POA: Diagnosis not present

## 2023-11-25 DIAGNOSIS — M129 Arthropathy, unspecified: Secondary | ICD-10-CM | POA: Diagnosis not present

## 2023-11-25 DIAGNOSIS — E119 Type 2 diabetes mellitus without complications: Secondary | ICD-10-CM | POA: Diagnosis not present

## 2023-11-29 DIAGNOSIS — Z419 Encounter for procedure for purposes other than remedying health state, unspecified: Secondary | ICD-10-CM | POA: Diagnosis not present

## 2023-12-01 ENCOUNTER — Other Ambulatory Visit (HOSPITAL_COMMUNITY): Payer: Self-pay | Admitting: Gerontology

## 2023-12-01 ENCOUNTER — Ambulatory Visit (HOSPITAL_COMMUNITY)
Admission: RE | Admit: 2023-12-01 | Discharge: 2023-12-01 | Disposition: A | Discharge status: Home / Self Care | Source: Ambulatory Visit | Attending: Gerontology | Admitting: Gerontology

## 2023-12-01 DIAGNOSIS — G8929 Other chronic pain: Secondary | ICD-10-CM | POA: Diagnosis not present

## 2023-12-01 DIAGNOSIS — M25512 Pain in left shoulder: Secondary | ICD-10-CM

## 2023-12-01 DIAGNOSIS — M19012 Primary osteoarthritis, left shoulder: Secondary | ICD-10-CM | POA: Diagnosis not present

## 2023-12-30 ENCOUNTER — Encounter (HOSPITAL_COMMUNITY): Admitting: Speech Pathology

## 2023-12-30 DIAGNOSIS — Z419 Encounter for procedure for purposes other than remedying health state, unspecified: Secondary | ICD-10-CM | POA: Diagnosis not present

## 2024-01-10 ENCOUNTER — Encounter (HOSPITAL_COMMUNITY): Admitting: Speech Pathology

## 2024-01-28 ENCOUNTER — Ambulatory Visit (INDEPENDENT_AMBULATORY_CARE_PROVIDER_SITE_OTHER): Admitting: Podiatry

## 2024-01-28 DIAGNOSIS — M79675 Pain in left toe(s): Secondary | ICD-10-CM

## 2024-01-28 DIAGNOSIS — M79674 Pain in right toe(s): Secondary | ICD-10-CM

## 2024-01-28 DIAGNOSIS — B353 Tinea pedis: Secondary | ICD-10-CM | POA: Diagnosis not present

## 2024-01-28 DIAGNOSIS — E1151 Type 2 diabetes mellitus with diabetic peripheral angiopathy without gangrene: Secondary | ICD-10-CM | POA: Diagnosis not present

## 2024-01-28 DIAGNOSIS — B351 Tinea unguium: Secondary | ICD-10-CM

## 2024-01-28 MED ORDER — CASTELLANI PAINT 1.5 % EX LIQD
1.0000 | Freq: Two times a day (BID) | CUTANEOUS | 0 refills | Status: AC | PRN
Start: 2024-01-28 — End: ?

## 2024-01-28 NOTE — Patient Instructions (Signed)
 Apply Castellani paint to affected webspaces using a Q tip 1-2 times a day as needed.  Once this area maceration resolves can maintain with over the counter tinactin powder.  Follow up as needed if this does not improve or worsens

## 2024-01-28 NOTE — Progress Notes (Unsigned)
  Subjective:  Patient ID: Cameron [name], male    DOB: [birth year],  MRN: [id number]  Chief Complaint  Patient presents with   [facility]    [facility] toenail trim A1C 5.9.     58 y.o. male presents with the above complaint. History confirmed with patient. Patient presenting with pain related to dystrophic thickened elongated nails. Patient is unable to trim own nails related to nail dystrophy and/or mobility issues. Patient does have a history of T2DM.  Last A1c 5.9.  No significant calluses today.  Skin appears improved from prior ketoconazole  use.  Does have some maceration in between the toes.  Objective:  Physical Exam: warm, good capillary refill nail exam onychomycosis of the toenails, onycholysis, dystrophic nails, and yellow discoloration with subungual debris. DP faintly pulses palpable, PT faintly pulses palpable, and protective sensation intact Left Foot:  Pain with palpation of nails due to elongation and dystrophic growth.  Maceration present left second interspace Right Foot: Pain with palpation of nails due to elongation and dystrophic growth.  Maceration with flaky dead skin buildup right fourth interspace Assessment:   1. Pain due to onychomycosis of toenails of both feet   2. Type 2 diabetes mellitus with diabetic peripheral angiopathy without gangrene, unspecified whether long term insulin  use (HCC)   3. Tinea pedis of both feet      Plan:  Patient was evaluated and treated and all questions answered.  # Tinea pedis-interdigital - Plantar skin appears improved from previous overall. - Castellani's paint applied to the affected webspaces today - Prescription sent in for OTC Castellani's paint to be applied to the affected webspaces 1-2 times daily as needed to treat maceration. - May treat with over-the-counter tolnaftate powder as well - Discussed importance of pedal hygiene and treatment of tinea pedis -I certify that this diagnosis represents a distinct and separate  diagnosis that requires evaluation and treatment separate from other procedures or diagnosis   #Onychomycosis with pain  -Nails palliatively debrided as below. -Educated on self-care  # Type 2 diabetes with peripheral angiopathy -Patient educated on diabetes. Discussed proper diabetic foot care and discussed risks and complications of disease. Educated patient in depth on reasons to return to the office immediately should he discover anything concerning or new on the feet. All questions answered. Discussed proper shoes as well.    Procedure: Nail Debridement Rationale: Pain Type of Debridement: manual, sharp debridement. Instrumentation: Nail nipper, rotary burr. Number of Nails: 10  Return in 3 to 4 weeks if dry skin between the toes does not improve  Return in about 3 months (around 04/29/2024) for Diabetic Foot Care.         Ethan Saddler, DPM [facility] / [facility]

## 2024-01-29 DIAGNOSIS — Z419 Encounter for procedure for purposes other than remedying health state, unspecified: Secondary | ICD-10-CM | POA: Diagnosis not present

## 2024-01-31 ENCOUNTER — Encounter: Payer: Self-pay | Admitting: Podiatry

## 2024-02-29 DIAGNOSIS — Z419 Encounter for procedure for purposes other than remedying health state, unspecified: Secondary | ICD-10-CM | POA: Diagnosis not present

## 2024-03-06 DIAGNOSIS — Z0001 Encounter for general adult medical examination with abnormal findings: Secondary | ICD-10-CM | POA: Diagnosis not present

## 2024-03-06 DIAGNOSIS — I10 Essential (primary) hypertension: Secondary | ICD-10-CM | POA: Diagnosis not present

## 2024-03-06 DIAGNOSIS — M25512 Pain in left shoulder: Secondary | ICD-10-CM | POA: Diagnosis not present

## 2024-03-06 DIAGNOSIS — E1165 Type 2 diabetes mellitus with hyperglycemia: Secondary | ICD-10-CM | POA: Diagnosis not present

## 2024-03-06 DIAGNOSIS — E7849 Other hyperlipidemia: Secondary | ICD-10-CM | POA: Diagnosis not present

## 2024-03-06 DIAGNOSIS — F17210 Nicotine dependence, cigarettes, uncomplicated: Secondary | ICD-10-CM | POA: Diagnosis not present

## 2024-03-07 ENCOUNTER — Other Ambulatory Visit (HOSPITAL_COMMUNITY)
Admission: RE | Admit: 2024-03-07 | Discharge: 2024-03-07 | Disposition: A | Discharge status: Home / Self Care | Source: Ambulatory Visit | Attending: Internal Medicine | Admitting: Internal Medicine

## 2024-03-07 DIAGNOSIS — I10 Essential (primary) hypertension: Secondary | ICD-10-CM | POA: Diagnosis not present

## 2024-03-07 LAB — HEPATIC FUNCTION PANEL
ALT: 21 U/L (ref 0–44)
AST: 21 U/L (ref 15–41)
Albumin: 3.7 g/dL (ref 3.5–5.0)
Alkaline Phosphatase: 75 U/L (ref 38–126)
Bilirubin, Direct: 0.1 mg/dL (ref 0.0–0.2)
Indirect Bilirubin: 0.9 mg/dL (ref 0.3–0.9)
Total Bilirubin: 1 mg/dL (ref 0.0–1.2)
Total Protein: 8.1 g/dL (ref 6.5–8.1)

## 2024-03-07 LAB — CBC WITH DIFFERENTIAL/PLATELET
Basophils Absolute: 0.1 K/uL (ref 0.0–0.1)
Basophils Relative: 1 %
Eosinophils Absolute: 0.1 K/uL (ref 0.0–0.5)
Eosinophils Relative: 1 %
HCT: 47.3 % (ref 39.0–52.0)
Hemoglobin: 15.5 g/dL (ref 13.0–17.0)
Lymphocytes Relative: 38 %
Lymphs Abs: 3.5 K/uL (ref 0.7–4.0)
MCH: 27.7 pg (ref 26.0–34.0)
MCHC: 32.8 g/dL (ref 30.0–36.0)
MCV: 84.6 fL (ref 80.0–100.0)
Monocytes Absolute: 0.2 K/uL (ref 0.1–1.0)
Monocytes Relative: 2 %
Neutro Abs: 5.3 K/uL (ref 1.7–7.7)
Neutrophils Relative %: 58 %
Platelets: 232 K/uL (ref 150–400)
RBC: 5.59 MIL/uL (ref 4.22–5.81)
RDW: 13.2 % (ref 11.5–15.5)
Smear Review: NORMAL
WBC: 9.1 K/uL (ref 4.0–10.5)
nRBC: 0 % (ref 0.0–0.2)

## 2024-03-07 LAB — BASIC METABOLIC PANEL WITH GFR
Anion gap: 13 (ref 5–15)
BUN: 10 mg/dL (ref 6–20)
CO2: 24 mmol/L (ref 22–32)
Calcium: 9.2 mg/dL (ref 8.9–10.3)
Chloride: 95 mmol/L — ABNORMAL LOW (ref 98–111)
Creatinine, Ser: 0.85 mg/dL (ref 0.61–1.24)
GFR, Estimated: >60 mL/min (ref >60)
Glucose, Bld: 301 mg/dL — ABNORMAL HIGH (ref 70–99)
Potassium: 3.8 mmol/L (ref 3.5–5.1)
Sodium: 132 mmol/L — ABNORMAL LOW (ref 135–145)

## 2024-03-07 LAB — HEMOGLOBIN A1C
Hgb A1c MFr Bld: 10.5 % — ABNORMAL HIGH (ref 4.8–5.6)
Mean Plasma Glucose: 254.65 mg/dL

## 2024-03-07 LAB — LIPID PANEL
Cholesterol: 135 mg/dL (ref 0–200)
HDL: 35 mg/dL — ABNORMAL LOW (ref >40)
LDL Cholesterol: 76 mg/dL (ref 0–99)
Total CHOL/HDL Ratio: 3.9 ratio
Triglycerides: 122 mg/dL (ref <150)
VLDL: 24 mg/dL (ref 0–40)

## 2024-03-07 LAB — PSA: Prostatic Specific Antigen: 0.35 ng/mL (ref 0.00–4.00)

## 2024-03-08 LAB — MICROALBUMIN / CREATININE URINE RATIO
Creatinine, Urine: 147 mg/dL
Microalb Creat Ratio: 6 mg/g{creat} (ref 0–29)
Microalb, Ur: 9.1 ug/mL — ABNORMAL HIGH

## 2024-03-31 ENCOUNTER — Ambulatory Visit: Admitting: Orthopedic Surgery

## 2024-03-31 ENCOUNTER — Encounter: Payer: Self-pay | Admitting: Orthopedic Surgery

## 2024-03-31 VITALS — BP 152/94 | HR 89 | Ht 70.0 in | Wt 169.0 lb

## 2024-03-31 DIAGNOSIS — Z419 Encounter for procedure for purposes other than remedying health state, unspecified: Secondary | ICD-10-CM | POA: Diagnosis not present

## 2024-03-31 DIAGNOSIS — G8929 Other chronic pain: Secondary | ICD-10-CM

## 2024-03-31 DIAGNOSIS — M25512 Pain in left shoulder: Secondary | ICD-10-CM

## 2024-03-31 NOTE — Patient Instructions (Signed)

## 2024-03-31 NOTE — Progress Notes (Signed)
 New Patient Visit  Assessment: Cameron [name] is a 58 y.o. male with the following: 1. Chronic left shoulder pain   Plan: Cameron [name] has progressively worsening pain in the left shoulder.  He reports several episodes where he fell on his shoulder, and had worsening pain.  He has finally sought treatment.  His pain did get worse following his stroke 2 years ago.  We reviewed radiographs in clinic today, which are concerning for proximal humeral migration, consistent with a chronic rotator cuff tear.  On physical exam, he has very good range of motion, as well as strength.  He does note some discomfort with strength testing.  He has not had any treatment for his shoulder previously.  He is not taking medications.  I recommended an injection.  He is in agreement this plan.  Depending on the efficacy of the injection, I have advised him that I would be aggressive with an MRI, given the potential for proximal humeral migration on x-ray.  He states understanding.  He will follow-up in 6 weeks for repeat evaluation.  Procedure note injection Left shoulder    Verbal consent was obtained to inject the left shoulder, subacromial space Timeout was completed to confirm the site of injection.  The skin was prepped with alcohol and ethyl chloride was sprayed at the injection site.  A 21-gauge needle was used to inject 40 mg of Depo-Medrol and 1% lidocaine (4 cc) into the subacromial space of the left shoulder using a posterolateral approach.  There were no complications. A sterile bandage was applied.   Follow-up: Return in about 6 weeks (around 05/12/2024).  Subjective:  Chief Complaint  Patient presents with   Shoulder Pain    L has a history of a few injuries over the yrs but getting progressively worse over the past yr. Has tried ibuprofen  but doesn't help like it used to.     History of Present Illness: Cameron [name] is a 58 y.o. male who has been referred by  Benita Shilling, MD for  evaluation of left shoulder pain.  He is right-hand dominant.  He states he had several injuries over the years.  He has fallen while playing basketball, which resulted in a lot of pain.  Since he had a stroke 2 years ago, he notes worsening pain in the left shoulder.  He has never been diagnosed with an injury.  No prior treatments.  He is not taking medicines for his left shoulder.  Pain is progressively worsening.   Review of Systems: No fevers or chills No numbness or tingling No chest pain No shortness of breath No bowel or bladder dysfunction No GI distress No headaches   Medical History:  Past Medical History:  Diagnosis Date   Chronic shoulder pain    Hypertension    Stroke [facility])     History reviewed. No pertinent surgical history.  History reviewed. No pertinent family history. Social History   Tobacco Use   Smoking status: Every Day    Current packs/day: 0.50    Types: Cigarettes   Smokeless tobacco: Never  Substance Use Topics   Alcohol use: Yes   Drug use: Yes    Types: Cocaine, Marijuana    Comment: last use 01/05/2023    No Known Allergies  No outpatient medications have been marked as taking for the 03/31/24 encounter (Office Visit) with Onesimo Oneil LABOR, MD.    Objective: BP (!) 152/94   Pulse 89   Ht 5' 10 (1.778  m)   Wt 169 lb (76.7 kg)   BMI 24.25 kg/m   Physical Exam:  General: Alert and oriented. and No acute distress. Gait: Normal gait.  Evaluation of left shoulder demonstrates no deformity.  No redness.  No specific injury.  No point tenderness.  He has full forward flexion, with some pain at extremes.  Positive Jobes.  4/5 supraspinatus and infraspinatus strength testing.  Negative belly press.  Fingers warm well-perfused.  IMAGING: I personally reviewed images previously obtained in clinic  X-rays left shoulder previously obtained.  No acute injuries.  There is evidence of proximal humeral migration.  Minimal degenerative  changes.   New Medications:  No orders of the defined types were placed in this encounter.     Oneil DELENA Horde, MD  03/31/2024 12:38 PM

## 2024-04-17 ENCOUNTER — Ambulatory Visit: Admitting: Nurse Practitioner

## 2024-05-05 ENCOUNTER — Ambulatory Visit: Admitting: Podiatry

## 2024-05-12 ENCOUNTER — Ambulatory Visit: Admitting: Orthopedic Surgery

## 2024-05-19 ENCOUNTER — Encounter: Payer: Self-pay | Admitting: Podiatry

## 2024-05-19 ENCOUNTER — Ambulatory Visit (INDEPENDENT_AMBULATORY_CARE_PROVIDER_SITE_OTHER): Admitting: Podiatry

## 2024-05-19 DIAGNOSIS — E1151 Type 2 diabetes mellitus with diabetic peripheral angiopathy without gangrene: Secondary | ICD-10-CM

## 2024-05-19 DIAGNOSIS — M79675 Pain in left toe(s): Secondary | ICD-10-CM

## 2024-05-19 DIAGNOSIS — M79674 Pain in right toe(s): Secondary | ICD-10-CM

## 2024-05-19 DIAGNOSIS — B351 Tinea unguium: Secondary | ICD-10-CM

## 2024-05-19 DIAGNOSIS — B353 Tinea pedis: Secondary | ICD-10-CM | POA: Diagnosis not present

## 2024-05-19 MED ORDER — TOLNAFTATE 1 % EX POWD: 1.0000 | Freq: Two times a day (BID) | CUTANEOUS | 0 refills | Status: AC

## 2024-05-19 NOTE — Progress Notes (Signed)
  Subjective:  Patient ID: [place] [name], male    DOB: 03/09/1966,  MRN: [id number]  Chief Complaint  Patient presents with   Diabetes    [facility] NIDDM A1C 10.5. Toenail trim.    58 y.o. male presents with the above complaint. History confirmed with patient. Patient presenting with pain related to dystrophic thickened elongated nails. Patient is unable to trim own nails related to nail dystrophy and/or mobility issues. Patient does have a history of T2DM.  Last A1c 10.5 significant increase from previous.  No significant calluses today.  There is significant dry flaky skin between the toes today.  Objective:  Physical Exam: warm, good capillary refill nail exam onychomycosis of the toenails, onycholysis, dystrophic nails, and yellow discoloration with subungual debris. DP faintly pulses palpable, PT faintly pulses palpable, and protective sensation intact Left Foot:  Pain with palpation of nails due to elongation and dystrophic growth.  Right Foot: Pain with palpation of nails due to elongation and dystrophic growth.  Webspaces bilaterally there is excessive dry flaky skin buildup with some maceration present. Assessment:   1. Tinea pedis of both feet   2. Pain due to onychomycosis of toenails of both feet   3. Type 2 diabetes mellitus with diabetic peripheral angiopathy without gangrene, unspecified whether long term insulin  use (HCC)      Plan:  Patient was evaluated and treated and all questions answered.  # Tinea pedis-interdigital - Still significant dry flaking interdigital skin - Next compliance with the Castellani's paint - Prescription for tolnaftate antifungal powder to be applied to the webspaces twice daily over the next 3 weeks sent to patient's pharmacy - Discussed importance of pedal hygiene with patient as well in the treatment of tinea pedis. -I certify that this diagnosis represents a distinct and separate diagnosis that requires evaluation and treatment separate from  other procedures or diagnosis   #Onychomycosis with pain  -Nails palliatively debrided as below. -Educated on self-care  Procedure: Nail Debridement Rationale: Pain Type of Debridement: manual, sharp debridement. Instrumentation: Nail nipper, rotary burr. Number of Nails: 10  Patient educated on diabetes. Discussed proper diabetic foot care and discussed risks and complications of disease. Educated patient in depth on reasons to return to the office immediately should he/she discover anything concerning or new on the feet. All questions answered. Discussed proper shoes as well.     Return in 3 to 4 weeks if dry skin between the toes does not improve  Return in about 3 months (around 08/19/2024) for Diabetic Foot Care.         Ethan Saddler, DPM [facility] / [facility]

## 2024-08-14 ENCOUNTER — Ambulatory Visit: Admitting: Podiatry

## 2024-08-15 NOTE — Progress Notes (Unsigned)
 " [name] [address] [place]. [name] [address] [phone]       OFFICE FOLLOW UP NOTE  C[name] Date of Birth:  [birth year] Medical Record Number:  [id number]    Primary neurologist: Dr. Rosemarie Reason for visit: Stroke follow-up    No chief complaint on file.      HPI:   Update 08/15/2024 Cameron [name]: patient returns for follow up visit.        History provided for reference purposes only Update 11/17/2023 [name]: Patient returns for follow-up visit accompanied by his sister.  He continues to have cognitive difficulties which he believes overall stable since prior visit but does feel possibly some worsening. He feels he continues to function at a slower pace, feels reaction time is impaired.  He was referred to SLP at prior visit and was evaluated on 10/24 who noted mild cognitive deficits but patient did not show for follow-up visits. He reports due to frequent court cases. He does agree to restarting.  He also reports left shoulder pain since his stroke. Does have hx of prior shoulder injury but was not experiencing any pain prior to his stroke.  He has not been seen by orthopedics for this pain.  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  Remains on atorvastatin  without side effects.  He self discontinued aspirin  as he did not feel this was needed.  He is willing to restart.  Blood pressure well-controlled.  Has been working on dietary measures such as limiting sodium intake which he believes has contributed to improved blood pressures.  He has not seen PCP recently, plans on calling to schedule follow-up visit.  Continued tobacco use, about 1 pack every 2-3 days.  Reports rare use of EtOH, denies street drug use.  Update 04/28/2023 Cameron [name]: Patient returns for stroke follow-up.  Reports doing well since prior  visit.  Reports some improvement of memory since last visit but still not at baseline, has difficulty with short memory and delayed recall, difficulty with multitasking and feels he moves and acts and moves much slower than before his stroke. His step brother assist with bill paying, he does not drive (was driving previously), manages own medications and does some cooking, maintains ADLs independently.  He does have some intermittent imbalance.  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 He has not worked with any therapies since hospital discharge.  Denies new stroke/TIA symptoms.  Completed 3 months DAPT, remains on aspirin  alone as well as atorvastatin  Routinely follows with PCP for stroke risk factor management Reports continued tobacco use, about 3-5 cigarettes per day but gradually working on complete cessation Denies any street/recreational drug or ETOH use  Consult visit 02/10/2023 Dr. Rosemarie: Cameron [name] is a 59 year old African-American male with past medical history significant for diabetes, hypertension, hyperlipidemia, polysubstance abuse including cocaine, marijuana, cigarettes and alcohol.  Initially presented on 12/08/2021 to [facility] with 5-day history of left-sided weakness, numbness and incoordination.  MRI scan showed right internal capsule/corona radiata infarct.  MRI of the brain showed no large vessel stenosis or occlusion.  MRI cervical spine showed moderate stenosis and mild to moderate left-sided foraminal narrowing at C3-4 mild spinal stenosis at C4-5.  Patient had some residual left-sided weakness but he returned on  01/05/2023 with new complaints of confusion, slurred speech and dizziness and gait ataxia.  The symptoms also were ongoing for several days prior to ER visit.  Urine drug screen was positive for cocaine and marijuana and alcohol level was also elevated.  MRI confirmed left MCA patchy  embolic infarct.  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  Carotid ultrasound showed less than 50% proximal left ICA stenosis.  LDL cholesterol was 74 mg percent.  Hemoglobin A1c was 5.9.  Echocardiogram showed ejection fraction of 60 to 65%.  Patient was advised to take dual antiplatelet therapy aspirin  and Plavix  for 3 months followed by aspirin  alone and aggressive risk factor modification.  He was counseled to quit cigarettes, alcohol, cocaine and marijuana.  He is seen for follow-up today in the office accompanied by his sister.  He states he has been compliant with his medications but states he has not been taking Plavix .  His blood pressure is well-controlled and today it is slightly elevated 147/93.  His tolerating aspirin  well without bruising or bleeding and Lipitor without muscle aches and pains.  He states his sugars are doing well.  He continues to have poor balance and occasionally gets dizzy and off balance.  His main complaint is that his short-term memory and cognitive difficulties.  .  He states his brain cannot think quickly and he has had trouble making new memories and handling his affairs.  Forgets appointments and recent information.C[place]on [name]  He is states his brain is slow in thinking.  He has a upcoming court date and feels he will not be able to follow the court proceedings well due to his memory and cognitive difficulties.  He denies any new recurrent stroke or TIA symptoms.  He admits to still doing polysubstance abuse and realizes that he [name] to change his lifestyle and quit doing so     ROS:   14 system review of systems is positive for those listed in HPI and all other systems negative  PMH:  Past Medical History:  Diagnosis Date   Chronic shoulder pain    Hypertension    Stroke [facility])     Social History:  Social History   Socioeconomic History   Marital status: Legally Separated    Spouse name: Not on file   Number of  children: Not on file   Years of education: Not on file   Highest education level: Not on file  Occupational History   Not on file  Tobacco Use   Smoking status: Every Day    Current packs/day: 0.50    Types: Cigarettes   Smokeless tobacco: Never  Substance and Sexual Activity   Alcohol use: Yes   Drug use: Yes    Types: Cocaine, Marijuana    Comment: last use 01/05/2023   Sexual activity: Not on file  Other Topics Concern   Not on file  Social History Narrative   Not on file   Social Drivers of Health   Tobacco Use: High Risk (05/19/2024)   Patient History    Smoking Tobacco Use: Every Day    Smokeless Tobacco Use: Never    Passive Exposure: Not on file  Financial Resource Strain: Not on file  Food Insecurity: No Food Insecurity (01/05/2023)   Hunger Vital Sign    Worried About Running Out of Food in the Last Year: Never true    Ran Out of Food in the Last Year: Never true  Transportation [name]: No  Transportation [name] (01/05/2023)   PRAPARE - Administrator, Civil Service (Medical): No    Lack of Transportation (Non-Medical): No  Physical Activity: Not on file  Stress: Not on file  Social Connections: Not on file  Intimate Partner Violence: Not At Risk (01/05/2023)   Humiliation, Afraid, Rape, and Kick questionnaire    Fear of Current or Ex-Partner: No    Emotionally Abused: No    Physically Abused: No    Sexually Abused: No  Depression (PHQ2-9): Not on file  Alcohol Screen: Not on file  Housing: Patient Unable To Answer (01/05/2023)   Housing    Last Housing Risk Score: 0  Utilities: Not At Risk (01/05/2023)   AHC Utilities    Threatened with loss of utilities: No  Health Literacy: Not on file    Medications:   Current Outpatient Medications on File Prior to Visit  Medication Sig Dispense Refill   amLODipine (NORVASC) 5 MG tablet Take 5 mg by mouth daily.     atorvastatin  (LIPITOR) 80 MG tablet Take 1 tablet (80 mg total) by mouth daily. 30 tablet  11   blood glucose meter kit and supplies KIT Dispense based on patient and insurance preference. Use up to four times daily as directed. 1 each 1   Castellani Paint 1.5 % LIQD Apply 1 Application topically 2 (two) times daily as needed. 30 mL 0   clopidogrel  (PLAVIX ) 75 MG tablet Take 75 mg by mouth daily.     glipiZIDE  (GLUCOTROL ) 5 MG tablet Take 1 tablet (5 mg total) by mouth daily before breakfast. 30 tablet 1   ketoconazole  (NIZORAL ) 2 % cream Apply 1 Application topically daily. 15 g 0   lisinopril  (ZESTRIL ) 20 MG tablet Take 1 tablet (20 mg total) by mouth daily. 30 tablet 1   metFORMIN  (GLUCOPHAGE ) 500 MG tablet Take 1 tablet (500 mg total) by mouth 2 (two) times daily with a meal. 60 tablet 1   tolnaftate  (TINACTIN) 1 % powder Apply 1 Application topically 2 (two) times daily. 45 g 0   No current facility-administered medications on file prior to visit.    Allergies:  No Known Allergies  Physical Exam There were no vitals filed for this visit.   There is no height or weight on file to calculate BMI.  General: well developed, well nourished, very pleasant middle-aged African-American male, seated, in no evident distress Head: head normocephalic and atraumatic.   Neck: supple with no carotid or supraclavicular bruits Cardiovascular: regular rate and rhythm, no murmurs Musculoskeletal: no deformity Skin:  no rash/petichiae Vascular:  Normal pulses all extremities  Neurologic Exam Mental Status: Awake and fully alert. No evidence of aphasia or dysarthria. Follows commands without difficulty.  Oriented to place and time. Recent memory impaired and remote memory intact. Attention span, concentration and fund of knowledge overall appropriate during visit. Mood and affect appropriate.   Cranial Nerves: Pupils equal, briskly reactive to light. Extraocular movements full without nystagmus. Visual fields full to confrontation. Hearing intact. Facial sensation intact.  Subtle left facial  asymmetry, tongue, palate moves normally and symmetrically.  Motor: Normal strength in right upper and lower extremities, very slight left sided weakness (chronic from prior stroke) Sensory.: intact to touch , pinprick , position and vibratory sensation.  Coordination: Rapid alternating movements normal in all extremities except slightly decreased left hand. Finger-to-nose and heel-to-shin performed accurately bilaterally. Gait and Station: Arises from chair without difficulty. Stance is normal. Gait demonstrates normal stride length and mild imbalance.  Reflexes: 1+ and symmetric. Toes downgoing.       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      PLAN:  History of strokes Residual deficit: Cognitive impairment and mild left-sided weakness.  Referral placed again to [facility] SLP.  Discussed potential benefit with routine visits with SLP for possible improvement of cognition Complains of left shoulder pain, unclear if exacerbation of prior shoulder pain after prior injury vs post stroke.  Encouraged discussion with PCP for evaluation with orthopedics Continues to pursue Social Security disability due to residual stroke deficits. Feels due to cognitive difficulties with  slower thinking, moving and impaired reaction time, he would not be able to perform his job adequately or safely. He is currently being set up for neurocognitive testing through Social Security disability restart aspirin  and continue atorvastatin  for secondary stroke prevention measures managed/prescribed by PCP.  Discussed importance of compliance with medications for further stroke prevention Continue close follow-up with PCP for aggressive stroke risk factor management including BP goal<130/90, and HLD with LDL goal<70  Discussed importance of complete tobacco cessation - advised to follow up with PCP if further assistance is needed Continue to avoid street/recreational drug and limit EtOH use    Follow-up in 9 months or call earlier if needed        Harlene Bogaert, [facility]  [facility] [address] [place], [name] [address]  Phone [phone] Fax [phone] Note: This document was prepared with digital dictation and possible smart phrase technology. Any transcriptional errors that result from this process are unintentional.   "

## 2024-08-16 ENCOUNTER — Encounter: Payer: Self-pay | Admitting: Adult Health

## 2024-08-16 ENCOUNTER — Ambulatory Visit: Admitting: Adult Health

## 2024-08-16 VITALS — BP 130/82 | HR 93 | Ht 70.0 in | Wt 175.4 lb

## 2024-08-16 DIAGNOSIS — E785 Hyperlipidemia, unspecified: Secondary | ICD-10-CM

## 2024-08-16 DIAGNOSIS — I69319 Unspecified symptoms and signs involving cognitive functions following cerebral infarction: Secondary | ICD-10-CM

## 2024-08-16 DIAGNOSIS — I63512 Cerebral infarction due to unspecified occlusion or stenosis of left middle cerebral artery: Secondary | ICD-10-CM | POA: Diagnosis not present

## 2024-08-16 DIAGNOSIS — I6523 Occlusion and stenosis of bilateral carotid arteries: Secondary | ICD-10-CM

## 2024-08-16 DIAGNOSIS — R55 Syncope and collapse: Secondary | ICD-10-CM

## 2024-08-16 DIAGNOSIS — E1169 Type 2 diabetes mellitus with other specified complication: Secondary | ICD-10-CM

## 2024-08-16 DIAGNOSIS — Z7984 Long term (current) use of oral hypoglycemic drugs: Secondary | ICD-10-CM | POA: Diagnosis not present

## 2024-08-16 NOTE — Patient Instructions (Addendum)
 Please call with any recurrent seizure like activity   We will check lab work today and you will be scheduled for an EEG which will look for increased risk of having seizures  You will be called to complete a carotid ultrasound to ensure no worsening of your carotid artery narrowing  Continue to stay active as tolerated - please let me know if you are interested in doing any physical therapy    Follow up in 6 months or call earlier if needed

## 2024-08-17 ENCOUNTER — Ambulatory Visit: Payer: Self-pay | Admitting: Adult Health

## 2024-08-17 LAB — CBC WITH DIFFERENTIAL/PLATELET
Basophils Absolute: 0 10*3/uL (ref 0.0–0.2)
Basos: 0 %
EOS (ABSOLUTE): 0.1 10*3/uL (ref 0.0–0.4)
Eos: 1 %
Hematocrit: 45.2 % (ref 37.5–51.0)
Hemoglobin: 14.1 g/dL (ref 13.0–17.7)
Immature Grans (Abs): 0 10*3/uL (ref 0.0–0.1)
Immature Granulocytes: 0 %
Lymphocytes Absolute: 2.9 10*3/uL (ref 0.7–3.1)
Lymphs: 32 %
MCH: 28 pg (ref 26.6–33.0)
MCHC: 31.2 g/dL — ABNORMAL LOW (ref 31.5–35.7)
MCV: 90 fL (ref 79–97)
Monocytes Absolute: 0.7 10*3/uL (ref 0.1–0.9)
Monocytes: 8 %
Neutrophils Absolute: 5.2 10*3/uL (ref 1.4–7.0)
Neutrophils: 59 %
Platelets: 183 10*3/uL (ref 150–450)
RBC: 5.04 x10E6/uL (ref 4.14–5.80)
RDW: 12.8 % (ref 11.6–15.4)
WBC: 9 10*3/uL (ref 3.4–10.8)

## 2024-08-17 LAB — COMPREHENSIVE METABOLIC PANEL WITH GFR
ALT: 24 [IU]/L (ref 0–44)
AST: 28 [IU]/L (ref 0–40)
Albumin: 4.3 g/dL (ref 3.8–4.9)
Alkaline Phosphatase: 73 [IU]/L (ref 47–123)
BUN/Creatinine Ratio: 9 (ref 9–20)
BUN: 8 mg/dL (ref 6–24)
Bilirubin Total: 0.4 mg/dL (ref 0.0–1.2)
CO2: 22 mmol/L (ref 20–29)
Calcium: 9.7 mg/dL (ref 8.7–10.2)
Chloride: 100 mmol/L (ref 96–106)
Creatinine, Ser: 0.91 mg/dL (ref 0.76–1.27)
Globulin, Total: 3.5 g/dL (ref 1.5–4.5)
Glucose: 145 mg/dL — ABNORMAL HIGH (ref 70–99)
Potassium: 4.8 mmol/L (ref 3.5–5.2)
Sodium: 135 mmol/L (ref 134–144)
Total Protein: 7.8 g/dL (ref 6.0–8.5)
eGFR: 99 mL/min/{1.73_m2}

## 2024-08-17 LAB — LIPID PANEL
Chol/HDL Ratio: 2.5 ratio (ref 0.0–5.0)
Cholesterol, Total: 114 mg/dL (ref 100–199)
HDL: 46 mg/dL
LDL Chol Calc (NIH): 55 mg/dL (ref 0–99)
Triglycerides: 60 mg/dL (ref 0–149)
VLDL Cholesterol Cal: 13 mg/dL (ref 5–40)

## 2024-08-17 LAB — HEMOGLOBIN A1C
Est. average glucose Bld gHb Est-mCnc: 180 mg/dL
Hgb A1c MFr Bld: 7.9 % — ABNORMAL HIGH (ref 4.8–5.6)

## 2024-08-18 ENCOUNTER — Ambulatory Visit: Admitting: Neurology

## 2024-08-18 DIAGNOSIS — R55 Syncope and collapse: Secondary | ICD-10-CM

## 2024-08-22 ENCOUNTER — Ambulatory Visit: Admitting: Family Medicine

## 2024-08-22 ENCOUNTER — Ambulatory Visit (HOSPITAL_COMMUNITY): Admission: RE | Admit: 2024-08-22 | Source: Ambulatory Visit

## 2024-08-22 ENCOUNTER — Encounter (HOSPITAL_COMMUNITY): Payer: Self-pay

## 2024-08-24 ENCOUNTER — Ambulatory Visit: Admitting: Podiatry

## 2024-09-18 ENCOUNTER — Ambulatory Visit: Admitting: Podiatry

## 2024-12-20 ENCOUNTER — Ambulatory Visit

## 2025-02-13 ENCOUNTER — Ambulatory Visit: Admitting: Adult Health
